# Patient Record
Sex: Female | Born: 1945
Health system: Southern US, Community
[De-identification: ages and names within clinical notes are randomized; demographics above are authoritative.]

## PROBLEM LIST (undated history)

## (undated) DIAGNOSIS — M1990 Unspecified osteoarthritis, unspecified site: Secondary | ICD-10-CM

## (undated) DIAGNOSIS — K219 Gastro-esophageal reflux disease without esophagitis: Secondary | ICD-10-CM

## (undated) DIAGNOSIS — I639 Cerebral infarction, unspecified: Secondary | ICD-10-CM

## (undated) DIAGNOSIS — I10 Essential (primary) hypertension: Secondary | ICD-10-CM

---

## 2019-05-20 ENCOUNTER — Ambulatory Visit: Payer: Self-pay | Attending: Internal Medicine

## 2019-05-20 DIAGNOSIS — Z23 Encounter for immunization: Secondary | ICD-10-CM

## 2019-05-20 NOTE — Progress Notes (Signed)
   Covid-19 Vaccination Clinic  Name:  Alyssa Small    MRN: [id number] DOB: 06-24-1945  05/20/2019  Alyssa Small was observed post Covid-19 immunization for 15 minutes without incident. She was provided with Vaccine Information Sheet and instruction to access the V-Safe system.   Alyssa Small was instructed to call 911 with any severe reactions post vaccine: Marland Kitchen Difficulty breathing  . Swelling of face and throat  . A fast heartbeat  . A bad rash all over body  . Dizziness and weakness   Immunizations Administered    Name Date Dose VIS Date Route   Pfizer COVID-19 Vaccine 05/20/2019  9:55 AM 0.3 mL 02/08/2019 Intramuscular   Manufacturer: [employer], [employer]   Lot: [id number]   NDC: [address]-8

## 2019-06-17 ENCOUNTER — Ambulatory Visit: Payer: Self-pay | Attending: Internal Medicine

## 2019-06-17 DIAGNOSIS — Z23 Encounter for immunization: Secondary | ICD-10-CM

## 2019-06-17 NOTE — Progress Notes (Signed)
   Covid-19 Vaccination Clinic  Name:  Alyssa Small    MRN: [id number] DOB: 04/04/1945  06/17/2019  Alyssa Small was observed post Covid-19 immunization for 15 minutes without incident. She was provided with Vaccine Information Sheet and instruction to access the V-Safe system.   Alyssa Small was instructed to call 911 with any severe reactions post vaccine: Marland Kitchen Difficulty breathing  . Swelling of face and throat  . A fast heartbeat  . A bad rash all over body  . Dizziness and weakness   Immunizations Administered    Name Date Dose VIS Date Route   Pfizer COVID-19 Vaccine 06/17/2019  9:49 AM 0.3 mL 04/24/2018 Intramuscular   Manufacturer: [employer], [employer]   Lot: [id number]   NDC: [address]-2

## 2019-10-07 ENCOUNTER — Other Ambulatory Visit (HOSPITAL_COMMUNITY): Payer: Medicare HMO

## 2019-10-07 ENCOUNTER — Emergency Department (HOSPITAL_COMMUNITY): Payer: Medicare HMO

## 2019-10-07 ENCOUNTER — Encounter (HOSPITAL_COMMUNITY)
Admission: EM | Disposition: A | Discharge status: Home Health | Payer: Self-pay | Source: Home / Self Care | Attending: Neurology

## 2019-10-07 ENCOUNTER — Other Ambulatory Visit: Payer: Self-pay

## 2019-10-07 ENCOUNTER — Emergency Department (HOSPITAL_COMMUNITY): Payer: Medicare HMO | Admitting: Certified Registered Nurse Anesthetist

## 2019-10-07 ENCOUNTER — Inpatient Hospital Stay (HOSPITAL_COMMUNITY)
Admission: EM | Admit: 2019-10-07 | Discharge: 2019-10-12 | DRG: 065 | Disposition: A | Discharge status: Home Health | Payer: Medicare HMO | Attending: Neurology | Admitting: Neurology

## 2019-10-07 ENCOUNTER — Inpatient Hospital Stay (HOSPITAL_COMMUNITY): Payer: Medicare HMO

## 2019-10-07 ENCOUNTER — Encounter (HOSPITAL_COMMUNITY): Payer: Self-pay

## 2019-10-07 DIAGNOSIS — Z8673 Personal history of transient ischemic attack (TIA), and cerebral infarction without residual deficits: Secondary | ICD-10-CM | POA: Diagnosis not present

## 2019-10-07 DIAGNOSIS — I493 Ventricular premature depolarization: Secondary | ICD-10-CM | POA: Diagnosis not present

## 2019-10-07 DIAGNOSIS — R29701 NIHSS score 1: Secondary | ICD-10-CM | POA: Diagnosis present

## 2019-10-07 DIAGNOSIS — R509 Fever, unspecified: Secondary | ICD-10-CM | POA: Diagnosis present

## 2019-10-07 DIAGNOSIS — M1990 Unspecified osteoarthritis, unspecified site: Secondary | ICD-10-CM | POA: Diagnosis present

## 2019-10-07 DIAGNOSIS — E876 Hypokalemia: Secondary | ICD-10-CM | POA: Diagnosis present

## 2019-10-07 DIAGNOSIS — R29702 NIHSS score 2: Secondary | ICD-10-CM | POA: Diagnosis not present

## 2019-10-07 DIAGNOSIS — R414 Neurologic neglect syndrome: Secondary | ICD-10-CM | POA: Diagnosis present

## 2019-10-07 DIAGNOSIS — R1310 Dysphagia, unspecified: Secondary | ICD-10-CM | POA: Diagnosis present

## 2019-10-07 DIAGNOSIS — F329 Major depressive disorder, single episode, unspecified: Secondary | ICD-10-CM | POA: Diagnosis present

## 2019-10-07 DIAGNOSIS — R6510 Systemic inflammatory response syndrome (SIRS) of non-infectious origin without acute organ dysfunction: Secondary | ICD-10-CM | POA: Diagnosis not present

## 2019-10-07 DIAGNOSIS — Z79890 Hormone replacement therapy (postmenopausal): Secondary | ICD-10-CM

## 2019-10-07 DIAGNOSIS — I63411 Cerebral infarction due to embolism of right middle cerebral artery: Principal | ICD-10-CM | POA: Diagnosis present

## 2019-10-07 DIAGNOSIS — E039 Hypothyroidism, unspecified: Secondary | ICD-10-CM | POA: Diagnosis present

## 2019-10-07 DIAGNOSIS — A419 Sepsis, unspecified organism: Secondary | ICD-10-CM

## 2019-10-07 DIAGNOSIS — Z79899 Other long term (current) drug therapy: Secondary | ICD-10-CM

## 2019-10-07 DIAGNOSIS — Z20822 Contact with and (suspected) exposure to covid-19: Secondary | ICD-10-CM | POA: Diagnosis present

## 2019-10-07 DIAGNOSIS — I10 Essential (primary) hypertension: Secondary | ICD-10-CM | POA: Diagnosis present

## 2019-10-07 DIAGNOSIS — R21 Rash and other nonspecific skin eruption: Secondary | ICD-10-CM | POA: Diagnosis not present

## 2019-10-07 DIAGNOSIS — D72828 Other elevated white blood cell count: Secondary | ICD-10-CM | POA: Diagnosis present

## 2019-10-07 DIAGNOSIS — I4891 Unspecified atrial fibrillation: Secondary | ICD-10-CM | POA: Diagnosis present

## 2019-10-07 DIAGNOSIS — Z8249 Family history of ischemic heart disease and other diseases of the circulatory system: Secondary | ICD-10-CM

## 2019-10-07 DIAGNOSIS — E785 Hyperlipidemia, unspecified: Secondary | ICD-10-CM | POA: Diagnosis present

## 2019-10-07 DIAGNOSIS — I639 Cerebral infarction, unspecified: Secondary | ICD-10-CM | POA: Diagnosis present

## 2019-10-07 DIAGNOSIS — I6389 Other cerebral infarction: Secondary | ICD-10-CM | POA: Diagnosis not present

## 2019-10-07 DIAGNOSIS — Z7982 Long term (current) use of aspirin: Secondary | ICD-10-CM

## 2019-10-07 DIAGNOSIS — R29810 Facial weakness: Secondary | ICD-10-CM | POA: Diagnosis present

## 2019-10-07 DIAGNOSIS — G8194 Hemiplegia, unspecified affecting left nondominant side: Secondary | ICD-10-CM | POA: Diagnosis present

## 2019-10-07 DIAGNOSIS — L259 Unspecified contact dermatitis, unspecified cause: Secondary | ICD-10-CM | POA: Diagnosis present

## 2019-10-07 DIAGNOSIS — R29704 NIHSS score 4: Secondary | ICD-10-CM | POA: Diagnosis not present

## 2019-10-07 DIAGNOSIS — K219 Gastro-esophageal reflux disease without esophagitis: Secondary | ICD-10-CM | POA: Diagnosis present

## 2019-10-07 DIAGNOSIS — R29703 NIHSS score 3: Secondary | ICD-10-CM | POA: Diagnosis not present

## 2019-10-07 DIAGNOSIS — R471 Dysarthria and anarthria: Secondary | ICD-10-CM | POA: Diagnosis present

## 2019-10-07 HISTORY — DX: Unspecified osteoarthritis, unspecified site: M19.90

## 2019-10-07 HISTORY — DX: Gastro-esophageal reflux disease without esophagitis: K21.9

## 2019-10-07 HISTORY — DX: Cerebral infarction, unspecified: I63.9

## 2019-10-07 HISTORY — DX: Essential (primary) hypertension: I10

## 2019-10-07 LAB — COMPREHENSIVE METABOLIC PANEL
ALT: 12 U/L (ref 0–44)
AST: 25 U/L (ref 15–41)
Albumin: 3.6 g/dL (ref 3.5–5.0)
Alkaline Phosphatase: 80 U/L (ref 38–126)
Anion gap: 11 (ref 5–15)
BUN: 9 mg/dL (ref 8–23)
CO2: 25 mmol/L (ref 22–32)
Calcium: 9.4 mg/dL (ref 8.9–10.3)
Chloride: 104 mmol/L (ref 98–111)
Creatinine, Ser: 0.94 mg/dL (ref 0.44–1.00)
GFR calc Af Amer: >60 mL/min (ref >60)
GFR calc non Af Amer: 59 mL/min — ABNORMAL LOW (ref >60)
Glucose, Bld: 107 mg/dL — ABNORMAL HIGH (ref 70–99)
Potassium: 4 mmol/L (ref 3.5–5.1)
Sodium: 140 mmol/L (ref 135–145)
Total Bilirubin: 0.9 mg/dL (ref 0.3–1.2)
Total Protein: 7.1 g/dL (ref 6.5–8.1)

## 2019-10-07 LAB — SARS CORONAVIRUS 2 BY RT PCR (HOSPITAL ORDER, PERFORMED IN ~~LOC~~ HOSPITAL LAB): SARS Coronavirus 2: NEGATIVE

## 2019-10-07 LAB — I-STAT CHEM 8, ED
BUN: 10 mg/dL (ref 8–23)
Calcium, Ion: 1.22 mmol/L (ref 1.15–1.40)
Chloride: 103 mmol/L (ref 98–111)
Creatinine, Ser: 0.9 mg/dL (ref 0.44–1.00)
Glucose, Bld: 106 mg/dL — ABNORMAL HIGH (ref 70–99)
HCT: 42 % (ref 36.0–46.0)
Hemoglobin: 14.3 g/dL (ref 12.0–15.0)
Potassium: 3.5 mmol/L (ref 3.5–5.1)
Sodium: 142 mmol/L (ref 135–145)
TCO2: 25 mmol/L (ref 22–32)

## 2019-10-07 LAB — DIFFERENTIAL
Abs Immature Granulocytes: 0.01 10*3/uL (ref 0.00–0.07)
Basophils Absolute: 0.1 10*3/uL (ref 0.0–0.1)
Basophils Relative: 1 %
Eosinophils Absolute: 0.2 10*3/uL (ref 0.0–0.5)
Eosinophils Relative: 3 %
Immature Granulocytes: 0 %
Lymphocytes Relative: 27 %
Lymphs Abs: 1.9 10*3/uL (ref 0.7–4.0)
Monocytes Absolute: 0.6 10*3/uL (ref 0.1–1.0)
Monocytes Relative: 9 %
Neutro Abs: 4.2 10*3/uL (ref 1.7–7.7)
Neutrophils Relative %: 60 %

## 2019-10-07 LAB — CBC
HCT: 41.2 % (ref 36.0–46.0)
Hemoglobin: 13.6 g/dL (ref 12.0–15.0)
MCH: 30 pg (ref 26.0–34.0)
MCHC: 33 g/dL (ref 30.0–36.0)
MCV: 90.9 fL (ref 80.0–100.0)
Platelets: 340 10*3/uL (ref 150–400)
RBC: 4.53 MIL/uL (ref 3.87–5.11)
RDW: 14.6 % (ref 11.5–15.5)
WBC: 7 10*3/uL (ref 4.0–10.5)
nRBC: 0 % (ref 0.0–0.2)

## 2019-10-07 LAB — PROTIME-INR
INR: 1 (ref 0.8–1.2)
Prothrombin Time: 12.9 seconds (ref 11.4–15.2)

## 2019-10-07 LAB — MRSA PCR SCREENING: MRSA by PCR: NEGATIVE

## 2019-10-07 LAB — CBG MONITORING, ED: Glucose-Capillary: 100 mg/dL — ABNORMAL HIGH (ref 70–99)

## 2019-10-07 LAB — APTT: aPTT: 28 seconds (ref 24–36)

## 2019-10-07 SURGERY — IR WITH ANESTHESIA
Anesthesia: General

## 2019-10-07 MED ORDER — SENNOSIDES-DOCUSATE SODIUM 8.6-50 MG PO TABS: 1.0000 | ORAL_TABLET | Freq: Every evening | ORAL | Status: DC | PRN

## 2019-10-07 MED ORDER — ACETAMINOPHEN 650 MG RE SUPP: 650.0000 mg | RECTAL | Status: DC | PRN

## 2019-10-07 MED ORDER — FENTANYL CITRATE (PF) 100 MCG/2ML IJ SOLN
INTRAMUSCULAR | Status: AC
  Filled 2019-10-07: qty 2

## 2019-10-07 MED ORDER — STROKE: EARLY STAGES OF RECOVERY BOOK: Freq: Once | Status: DC

## 2019-10-07 MED ORDER — SODIUM CHLORIDE 0.9% FLUSH
3.0000 mL | Freq: Once | INTRAVENOUS | Status: AC
  Administered 2019-10-07: 3 mL via INTRAVENOUS

## 2019-10-07 MED ORDER — IOHEXOL 350 MG/ML SOLN
80.0000 mL | Freq: Once | INTRAVENOUS | Status: AC | PRN
  Administered 2019-10-07: 80 mL via INTRAVENOUS

## 2019-10-07 MED ORDER — CHLORHEXIDINE GLUCONATE CLOTH 2 % EX PADS
6.0000 | MEDICATED_PAD | Freq: Every day | CUTANEOUS | Status: DC
  Administered 2019-10-07 – 2019-10-09 (×2): 6 via TOPICAL

## 2019-10-07 MED ORDER — SODIUM CHLORIDE 0.9 % IV SOLN: INTRAVENOUS | Status: DC

## 2019-10-07 MED ORDER — ACETAMINOPHEN 160 MG/5ML PO SOLN: 650.0000 mg | ORAL | Status: DC | PRN

## 2019-10-07 MED ORDER — ACETAMINOPHEN 325 MG PO TABS
650.0000 mg | ORAL_TABLET | ORAL | Status: DC | PRN
  Administered 2019-10-08 – 2019-10-11 (×4): 650 mg via ORAL
  Filled 2019-10-07 (×4): qty 2

## 2019-10-07 NOTE — H&P (Deleted)
Neurology Consultation  Reason for Consult: Code stroke Referring Physician: Jeraldine Loots  CC: Facial droop  History is obtained from: Patient  HPI: Alyssa Small is a 74 y.o. female with history of stroke, hypertension and GERD.  Patient was found to be last known normal at 8 AM.  At that time family noted that there was left facial droop and brought patient to the emergency department for further evaluation.  In the ED she was also noted to have left-sided weakness thus code stroke was called while in triage.  On exam however in the CT room there was no noted left-sided weakness and or facial droop however she did have left-sided neglect.  She unfortunately was outside the window for TPA.  CTA was ordered to further evaluate for possible large vessel occlusion.  Currently patient still continues to feel there is nothing wrong with her.   LKW: 8 AM this morning tpa given?: no, out of window Premorbid modified Rankin scale (mRS): 0 NIHSS 1a Level of Conscious.: 0 1b LOC Questions: 0 1c LOC Commands: 0 2 Best Gaze: 0 3 Visual: 0 4 Facial Palsy: 0 5a Motor Arm - left: 0 5b Motor Arm - Right: 0 6a Motor Leg - Left: 0 6b Motor Leg - Right: 0 7 Limb Ataxia: 0 8 Sensory: 0 9 Best Language: 0 10 Dysarthria: 0 11 Extinct. and Inatten.: 1 TOTAL: 1   Past Medical History:  Diagnosis Date  . Arthritis   . GERD (gastroesophageal reflux disease)   . Hypertension   . Stroke [facility])    2007 no deficits    Family History  Problem Relation Age of Onset  . Hypertension Mother   . Hypertension Father    Social History:   reports that she has never smoked. She has never used smokeless tobacco. She reports that she does not drink alcohol and does not use drugs.  Medications  Current Facility-Administered Medications:  .  sodium chloride flush (NS) 0.9 % injection 3 mL, 3 mL, Intravenous, Once, Gerhard Munch, MD No current outpatient medications on file.  ROS:     General ROS:  negative for - chills, fatigue, fever, night sweats, weight gain or weight loss Psychological ROS: negative for - behavioral disorder, hallucinations, memory difficulties, mood swings or suicidal ideation Ophthalmic ROS: negative for - blurry vision, double vision, eye pain or loss of vision ENT ROS: negative for - epistaxis, nasal discharge, oral lesions, sore throat, tinnitus or vertigo Allergy and Immunology ROS: negative for - hives or itchy/watery eyes Hematological and Lymphatic ROS: negative for - bleeding problems, bruising or swollen lymph nodes Endocrine ROS: negative for - galactorrhea, hair pattern changes, polydipsia/polyuria or temperature intolerance Respiratory ROS: negative for - cough, hemoptysis, shortness of breath or wheezing Cardiovascular ROS: negative for - chest pain, dyspnea on exertion, edema or irregular heartbeat Gastrointestinal ROS: negative for - abdominal pain, diarrhea, hematemesis, nausea/vomiting or stool incontinence Genito-Urinary ROS: negative for - dysuria, hematuria, incontinence or urinary frequency/urgency Musculoskeletal ROS: negative for - joint swelling or muscular weakness Neurological ROS: as noted in HPI Dermatological ROS: negative for rash and skin lesion changes  Exam: Current vital signs: BP (!) 163/74 (BP Location: Right Arm)   Pulse 76   Temp 98.8 F (37.1 C) (Oral)   Resp 19   SpO2 98%  Vital signs in last 24 hours: Temp:  [98.8 F (37.1 C)] 98.8 F (37.1 C) (08/09 1257) Pulse Rate:  [72-76] 76 (08/09 1355) Resp:  [15-19] 19 (08/09 1355) BP: (147-163)/(73-74)  163/74 (08/09 1355) SpO2:  [96 %-98 %] 98 % (08/09 1355)   Constitutional: Appears well-developed and well-nourished.  Eyes: No scleral injection HENT: No OP obstrucion Head: Normocephalic.  Cardiovascular: Normal rate and regular rhythm.  Respiratory: Effort normal, non-labored breathing GI: Soft.  No distension. There is no tenderness.  Skin: WDI  Neuro: Mental  Status: Patient is awake, alert, oriented to person, place, month, year, and situation.  Speech is clear with no aphasia or dysarthria.  She is able to name, repeat and has good comprehension.  Patient is able to follow commands without any difficulties. Cranial Nerves: II: Visual Fields are full.  III,IV, VI: EOMI without ptosis or diploplia. Pupils equal, round and reactive to light V: Facial sensation is symmetric to temperature VII: Facial movement is symmetric.  VIII: hearing is intact to voice X: Palat elevates symmetrically XI: Shoulder shrug is symmetric. XII: tongue is midline without atrophy or fasciculations.  Motor: Tone is normal. Bulk is normal. 5/5 strength was present in all four extremities.  No drift Sensory: Sensation is symmetric to light touch and temperature in the arms and legs. DSS does show neglect to the left arm and leg Deep Tendon Reflexes: 2+ and symmetric in the biceps and patellae.  Plantars: Toes are downgoing bilaterally.  Cerebellar: FNF and HKS are intact bilaterally  Labs I have reviewed labs in epic and the results pertinent to this consultation are:   CBC    Component Value Date/Time   WBC 7.0 10/07/2019 1301   RBC 4.53 10/07/2019 1301   HGB 14.3 10/07/2019 1309   HCT 42.0 10/07/2019 1309   PLT 340 10/07/2019 1301   MCV 90.9 10/07/2019 1301   MCH 30.0 10/07/2019 1301   MCHC 33.0 10/07/2019 1301   RDW 14.6 10/07/2019 1301   LYMPHSABS 1.9 10/07/2019 1301   MONOABS 0.6 10/07/2019 1301   EOSABS 0.2 10/07/2019 1301   BASOSABS 0.1 10/07/2019 1301    CMP     Component Value Date/Time   NA 142 10/07/2019 1309   K 3.5 10/07/2019 1309   CL 103 10/07/2019 1309   CO2 25 10/07/2019 1301   GLUCOSE 106 (H) 10/07/2019 1309   BUN 10 10/07/2019 1309   CREATININE 0.90 10/07/2019 1309   CALCIUM 9.4 10/07/2019 1301   PROT 7.1 10/07/2019 1301   ALBUMIN 3.6 10/07/2019 1301   AST 25 10/07/2019 1301   ALT 12 10/07/2019 1301   ALKPHOS 80  10/07/2019 1301   BILITOT 0.9 10/07/2019 1301   GFRNONAA 59 (L) 10/07/2019 1301   GFRAA >60 10/07/2019 1301   Imaging I have reviewed the images obtained:  CT-scan of the brain-no acute intracranial findings.  Old left cerebellar infarct.  CTA-head and neck/CT perfusion-there is occlusion of the proximal right M2 MCA branch with som  reconstruction more distally.  Perfusion imaging demonstrates no evidence of core infarct.  However there is a 33 mm of penumbra identified in the posterior right MCA territory.  It is also noted that there is no measurable stenosis in the neck.  There is a 1 mm outpouching the distal supraclinoid left ICA likely affecting the infundibulum  Felicie Morn PA-C [facility] [phone]  M-F  (9:00 am- 5:00 PM)  10/07/2019, 2:01 PM     Assessment/plan This 74 year old female brought to the emergency department after family noted a left facial droop.  While in the triage she had left-sided weakness which resolved by the time she had reached CT.  It was noted however she  did have left-sided neglect to DSS.  CTA and CTP were obtained which did show a right M2 MCA branch proximal occlusion.  Patient was out of the window for TPA however was brought to interventional radiology for percutaneous artery thrombectomy.   Plan:   Acuity: Acute -Admit to: ICU -Continue Statin -Blood pressure control, goal of SYS <180 -MRI/ECHO/A1C/Lipid panel. -Hyperglycemia management per SSI to maintain glucose 140-180mg /dL. -PT/OT/ST therapies and recommendations when able  CNS -Close neuro monitoring  CV -Aggressive BP control, goal SBP <180 -Titrate oral agents and IV agents if necessary  Hyperlipidemia, unspecified  - Statin for goal LDL < 70   -goal HgbA1c < 7  Fluids -Replete gently at 75 cc/h  Prophylaxis DVT: SCDs GI: Protonix Bowel: Senokot  Diet: NPO until cleared by speech  Code Status: Full Code

## 2019-10-07 NOTE — ED Notes (Addendum)
Provider made aware of acute changes in pt status

## 2019-10-07 NOTE — ED Notes (Signed)
Patient transported to MRI 

## 2019-10-07 NOTE — ED Triage Notes (Signed)
Pt BIB [facility], from home, family and FD witnessed Left side facial droop, Left arm weakness, leaning to the Left and slurred speech, LKW 0800. Sister found her at 11am with these symptoms, lasting 30 mins, symptoms resolved prior to EMS arrival, no new symptoms in transport. NIH 0  HR 82 NSR BP 220/110 manual, 168/108 RR 16 CBG 91 96% RA  RR 18  18G LAC

## 2019-10-07 NOTE — Consult Note (Addendum)
Neurology Consultation  Reason for Consult: Code stroke Referring Physician: Jeraldine Loots  CC: Facial droop  History is obtained from: Patient  HPI: Alyssa Small is a 74 y.o. female with history of stroke, hypertension and GERD.  Patient was found to be last known normal at 8 AM.  At that time family noted that there was left facial droop and brought patient to the emergency department for further evaluation.  In the ED she was initially felt to be normal but then later noted to again have left-sided symptoms thus code stroke was called while in triage.  On exam however in the CT room there was no noted left-sided weakness and or facial droop however she did have left-sided neglect.  She unfortunately was outside the window for TPA.  CTA was ordered to further evaluate for possible large vessel occlusion.  Currently patient still continues to feel there is nothing wrong with her.   LKW: 8 AM this morning tpa given?: no, out of window Premorbid modified Rankin scale (mRS): 0 NIHSS 1a Level of Conscious.: 0 1b LOC Questions: 0 1c LOC Commands: 0 2 Best Gaze: 0 3 Visual: 0 4 Facial Palsy: 0 5a Motor Arm - left: 0 5b Motor Arm - Right: 0 6a Motor Leg - Left: 0 6b Motor Leg - Right: 0 7 Limb Ataxia: 0 8 Sensory: 0 9 Best Language: 0 10 Dysarthria: 0 11 Extinct. and Inatten.: 1 TOTAL: 1   Past Medical History:  Diagnosis Date  . Arthritis   . GERD (gastroesophageal reflux disease)   . Hypertension   . Stroke [facility])    2007 no deficits    Family History  Problem Relation Age of Onset  . Hypertension Mother   . Hypertension Father    Social History:   reports that she has never smoked. She has never used smokeless tobacco. She reports that she does not drink alcohol and does not use drugs.  Medications  Current Facility-Administered Medications:  .  sodium chloride flush (NS) 0.9 % injection 3 mL, 3 mL, Intravenous, Once, Gerhard Munch, MD No current outpatient  medications on file.  ROS:     General ROS: negative for - chills, fatigue, fever, night sweats, weight gain or weight loss Psychological ROS: negative for - behavioral disorder, hallucinations, memory difficulties, mood swings or suicidal ideation Ophthalmic ROS: negative for - blurry vision, double vision, eye pain or loss of vision ENT ROS: negative for - epistaxis, nasal discharge, oral lesions, sore throat, tinnitus or vertigo Allergy and Immunology ROS: negative for - hives or itchy/watery eyes Hematological and Lymphatic ROS: negative for - bleeding problems, bruising or swollen lymph nodes Endocrine ROS: negative for - galactorrhea, hair pattern changes, polydipsia/polyuria or temperature intolerance Respiratory ROS: negative for - cough, hemoptysis, shortness of breath or wheezing Cardiovascular ROS: negative for - chest pain, dyspnea on exertion, edema or irregular heartbeat Gastrointestinal ROS: negative for - abdominal pain, diarrhea, hematemesis, nausea/vomiting or stool incontinence Genito-Urinary ROS: negative for - dysuria, hematuria, incontinence or urinary frequency/urgency Musculoskeletal ROS: negative for - joint swelling or muscular weakness Neurological ROS: as noted in HPI Dermatological ROS: negative for rash and skin lesion changes  Exam: Current vital signs: BP (!) 163/74 (BP Location: Right Arm)   Pulse 76   Temp 98.8 F (37.1 C) (Oral)   Resp 19   SpO2 98%  Vital signs in last 24 hours: Temp:  [98.8 F (37.1 C)] 98.8 F (37.1 C) (08/09 1257) Pulse Rate:  [72-76] 76 (08/09 1355)  Resp:  [15-19] 19 (08/09 1355) BP: (147-163)/(73-74) 163/74 (08/09 1355) SpO2:  [96 %-98 %] 98 % (08/09 1355)   Constitutional: Appears well-developed and well-nourished.  Eyes: No scleral injection HENT: No OP obstrucion Head: Normocephalic.  Cardiovascular: Normal rate and regular rhythm.  Respiratory: Effort normal, non-labored breathing GI: Soft.  No distension. There  is no tenderness.  Skin: WDI  Neuro: Mental Status: Patient is awake, alert, oriented to person, place, month, year, and situation.  Speech is clear with no aphasia or dysarthria.  She is able to name, repeat and has good comprehension.  Patient is able to follow commands without any difficulties. Cranial Nerves: II: Visual Fields are full.  III,IV, VI: EOMI without ptosis or diploplia. Pupils equal, round and reactive to light V: Facial sensation is symmetric to temperature VII: Facial movement is symmetric.  VIII: hearing is intact to voice X: Palat elevates symmetrically XI: Shoulder shrug is symmetric. XII: tongue is midline without atrophy or fasciculations.  Motor: Tone is normal. Bulk is normal. 5/5 strength was present in all four extremities.  No drift Sensory: Sensation is symmetric to light touch and temperature in the arms and legs. DSS does show neglect to the left arm and leg Deep Tendon Reflexes: 2+ and symmetric in the biceps and patellae.  Plantars: Toes are downgoing bilaterally.  Cerebellar: FNF and HKS are intact bilaterally  Labs I have reviewed labs in epic and the results pertinent to this consultation are:   CBC    Component Value Date/Time   WBC 7.0 10/07/2019 1301   RBC 4.53 10/07/2019 1301   HGB 14.3 10/07/2019 1309   HCT 42.0 10/07/2019 1309   PLT 340 10/07/2019 1301   MCV 90.9 10/07/2019 1301   MCH 30.0 10/07/2019 1301   MCHC 33.0 10/07/2019 1301   RDW 14.6 10/07/2019 1301   LYMPHSABS 1.9 10/07/2019 1301   MONOABS 0.6 10/07/2019 1301   EOSABS 0.2 10/07/2019 1301   BASOSABS 0.1 10/07/2019 1301    CMP     Component Value Date/Time   NA 142 10/07/2019 1309   K 3.5 10/07/2019 1309   CL 103 10/07/2019 1309   CO2 25 10/07/2019 1301   GLUCOSE 106 (H) 10/07/2019 1309   BUN 10 10/07/2019 1309   CREATININE 0.90 10/07/2019 1309   CALCIUM 9.4 10/07/2019 1301   PROT 7.1 10/07/2019 1301   ALBUMIN 3.6 10/07/2019 1301   AST 25 10/07/2019  1301   ALT 12 10/07/2019 1301   ALKPHOS 80 10/07/2019 1301   BILITOT 0.9 10/07/2019 1301   GFRNONAA 59 (L) 10/07/2019 1301   GFRAA >60 10/07/2019 1301   Imaging I have reviewed the images obtained:  CT-scan of the brain-no acute intracranial findings.  Old left cerebellar infarct.  CTA-head and neck/CT perfusion-there is occlusion of the proximal right M2 MCA branch with som  reconstruction more distally.  Perfusion imaging demonstrates no evidence of core infarct.  However there is a 33 mm of penumbra identified in the posterior right MCA territory.  It is also noted that there is no measurable stenosis in the neck.  There is a 1 mm outpouching the distal supraclinoid left ICA likely affecting the infundibulum  Felicie Morn PA-C [facility] [phone]  M-F  (9:00 am- 5:00 PM)  10/07/2019, 2:01 PM     Assessment/plan This 74 year old female brought to the emergency department after family noted a left facial droop.  While in the triage she had left-sided weakness which resolved by the time she had  reached CT.  It was noted however she did have left-sided neglect to DSS.  CTA and CTP were obtained which did show a right M2 MCA branch proximal occlusion.  Patient was out of the window for TPA however was brought to interventional radiology for percutaneous artery thrombectomy given concern for waxing and waning symptoms and potential for eventual failure of collateral circulation.  After discussion of risks and benefits with interventional radiology and family, the decision was made to defer intervention, especially given that even the patient's neglect had resolved by the time of arrival to the IR suite and her NIH score had improved to 0.  Recommend -MRI of the brain without contrast -Transthoracic Echo,   -Start patient on ASA 325mg  daily  -Start or continue Atorvastatin 80 mg/other high intensity statin -BP goal: permissive HTN upto 220/120 mmHg -HBAIC and Lipid  profile -Telemetry monitoring -Frequent neuro checks (every 2 hours until stable) -NPO until passes stroke swallow screen -PT/OT --Stroke team will follow in consultation  # please page stroke NP  Or  PA  Or MD from 8am -4 pm  as this patient from this time will be  followed by the stroke.   You can look them up on [email or web address]  Password [email or web address]   Addendum: I personally examined the patient, gathered history from the patient and family as well as chart review, reviewed the imaging including head CT, CTA, CT perfusion, and MRI, coordinated care with neuro interventional radiology, counseled the patient and family.  I have edited the APP note above as necessary.  In addition, later in the afternoon (4:30 PM) the patient worsened again, with a significant left-sided facial droop, dysarthria, and neglect although she remained strong in the left upper extremity and left lower extremity.  She was reevaluated and again an extended discussion with family and patient given her nondisabling symptoms and the significant risk of a tandem stenosis/distal occlusion the decision was made to continue with medical management only.    Addended for charge capture

## 2019-10-07 NOTE — ED Provider Notes (Signed)
[facility] EMERGENCY DEPARTMENT Provider Note   CSN: [id number] Arrival date & time: 10/07/19  1246  An emergency department physician performed an initial assessment on this suspected stroke patient at 1351.  History Chief Complaint  Patient presents with  . Transient Ischemic Attack    Alyssa Small is a 74 y.o. female.  The history is provided by the patient and a relative.  Neurologic Problem This is a new problem. The current episode started 3 to 5 hours ago. The problem occurs rarely. The problem has been resolved. Pertinent negatives include no chest pain, no abdominal pain, no headaches and no shortness of breath. Nothing aggravates the symptoms. Nothing relieves the symptoms. She has tried nothing for the symptoms. The treatment provided no relief.   74 year old female with history of stroke in 2007 without residual deficits presents with concerns for new neurologic deficits.  Patient last known well at 800 this morning.  Patient was found by her niece and was found to have left-sided weakness as well as slurred speech.  The symptoms lasted less than 30 minutes before resolution.  Patient was hypertensive with EMS and upon arrival to the emergency department patient had another brief episode of left-sided weakness.  Patient does not remember feeling weak earlier this morning.  Denies fever, chills, headache, weakness, numbness, tingling.   Past Medical History:  Diagnosis Date  . Arthritis   . GERD (gastroesophageal reflux disease)   . Hypertension   . Stroke [facility])    2007 no deficits     Patient Active Problem List   Diagnosis Date Noted  . Stroke (cerebrum) (HCC) 10/07/2019    History reviewed. No pertinent surgical history.   OB History   No obstetric history on file.     Family History  Problem Relation Age of Onset  . Hypertension Mother   . Hypertension Father     Social History   Tobacco Use  . Smoking status: Never Smoker  .  Smokeless tobacco: Never Used  Substance Use Topics  . Alcohol use: Never  . Drug use: Never    Home Medications Prior to Admission medications   Medication Sig Start Date End Date Taking? Authorizing Provider  aspirin EC 81 MG tablet Take 81 mg by mouth daily. Swallow whole.   Yes [provider]  levothyroxine (SYNTHROID) 75 MCG tablet Take 75 mcg by mouth every morning. 07/12/19  Yes [provider]  simvastatin (ZOCOR) 20 MG tablet Take 20 mg by mouth at bedtime. 07/12/19  Yes [provider]  traZODone (DESYREL) 50 MG tablet Take 50 mg by mouth at bedtime. 08/29/19   [provider]    Allergies    Patient has no known allergies.  Review of Systems   Review of Systems  Constitutional: Negative for chills and fever.  HENT: Negative for ear pain and sore throat.   Eyes: Negative for pain and visual disturbance.  Respiratory: Negative for cough and shortness of breath.   Cardiovascular: Negative for chest pain and palpitations.  Gastrointestinal: Negative for abdominal pain and vomiting.  Genitourinary: Negative for dysuria and hematuria.  Musculoskeletal: Negative for arthralgias and back pain.  Skin: Negative for color change and rash.  Neurological: Negative for seizures, syncope and headaches.       L sided weakness and slurred speech now resolved.  All other systems reviewed and are negative.   Physical Exam Updated Vital Signs BP (!) 161/79   Pulse 73   Temp 98.8 F (37.1  C) (Oral)   Resp (!) 23   SpO2 96%   Physical Exam Vitals and nursing note reviewed.  Constitutional:      General: She is not in acute distress.    Appearance: She is well-developed.  HENT:     Head: Normocephalic and atraumatic.  Eyes:     Conjunctiva/sclera: Conjunctivae normal.  Cardiovascular:     Rate and Rhythm: Normal rate and regular rhythm.     Heart sounds: No murmur heard.   Pulmonary:     Effort: Pulmonary effort is normal. No respiratory  distress.     Breath sounds: Normal breath sounds.  Abdominal:     Palpations: Abdomen is soft.     Tenderness: There is no abdominal tenderness.  Musculoskeletal:     Cervical back: Neck supple.  Skin:    General: Skin is warm and dry.  Neurological:     General: No focal deficit present.     Mental Status: She is alert and oriented to person, place, and time. Mental status is at baseline.     Cranial Nerves: Cranial nerves are intact. No cranial nerve deficit.     Sensory: Sensation is intact. No sensory deficit.     Motor: Motor function is intact. No weakness.     Coordination: Coordination normal.     ED Results / Procedures / Treatments   Labs (all labs ordered are listed, but only abnormal results are displayed) Labs Reviewed  COMPREHENSIVE METABOLIC PANEL - Abnormal; Notable for the following components:      Result Value   Glucose, Bld 107 (*)    GFR calc non Af Amer 59 (*)    All other components within normal limits  I-STAT CHEM 8, ED - Abnormal; Notable for the following components:   Glucose, Bld 106 (*)    All other components within normal limits  SARS CORONAVIRUS 2 BY RT PCR [facility] ORDER, PERFORMED IN [place] [facility])  PROTIME-INR  APTT  CBC  DIFFERENTIAL  CBG MONITORING, ED    EKG EKG Interpretation  Date/Time:  Monday October 07 2019 12:53:33 EDT Ventricular Rate:  75 PR Interval:  162 QRS Duration: 76 QT Interval:  434 QTC Calculation: 484 R Axis:   33 Text Interpretation: Normal sinus rhythm Artifact Otherwise within normal limits Confirmed by Gerhard Munch [phone]) on 10/07/2019 1:28:12 PM   Radiology CT HEAD WO CONTRAST  Result Date: 10/07/2019 CLINICAL DATA:  Left-sided weakness.  Transient ischemic attack. EXAM: CT HEAD WITHOUT CONTRAST TECHNIQUE: Contiguous axial images were obtained from the base of the skull through the vertex without intravenous contrast. COMPARISON:  CT head 11/01/2014. FINDINGS: Brain: There is no evidence  of acute intracranial hemorrhage, mass lesion, brain edema or extra-axial fluid collection. Old inferior left cerebellar infarct again noted with associated encephalomalacia. The ventricles and subarachnoid spaces are otherwise appropriately size for age. There is mild asymmetric gliosis in the right occipital white matter. There is no CT evidence of acute cortical infarction. Vascular: Intracranial vascular calcifications. No hyperdense vessel identified. Skull: Negative for fracture or focal lesion. Sinuses/Orbits: The visualized paranasal sinuses and mastoid air cells are clear. No orbital abnormalities are seen. Other: None. IMPRESSION: 1. No acute intracranial findings. 2. Old left cerebellar infarct. Electronically Signed   By: Carey Bullocks M.D.   On: 10/07/2019 13:33   CT CEREBRAL PERFUSION W CONTRAST  Result Date: 10/07/2019 CLINICAL DATA:  Left-sided weakness EXAM: CT ANGIOGRAPHY HEAD AND NECK CT PERFUSION BRAIN TECHNIQUE: Multidetector CT imaging of  the head and neck was performed using the standard protocol during bolus administration of intravenous contrast. Multiplanar CT image reconstructions and MIPs were obtained to evaluate the vascular anatomy. Carotid stenosis measurements (when applicable) are obtained utilizing NASCET criteria, using the distal internal carotid diameter as the denominator. Multiphase CT imaging of the brain was performed following IV bolus contrast injection. Subsequent parametric perfusion maps were calculated using RAPID software. CONTRAST:  80mL OMNIPAQUE IOHEXOL 350 MG/ML SOLN COMPARISON:  None. FINDINGS: CTA NECK FINDINGS Aortic arch: Great vessel origins are patent. Right carotid system: Patent. Partially retropharyngeal course of the ICA. No measurable stenosis. Left carotid system: Patent. Partially retropharyngeal course of the ICA. No measurable stenosis. Vertebral arteries: Patent and codominant. Skeleton: Cervical spine degenerative changes. Other neck: No mass  or adenopathy. Upper chest: Included upper lungs are clear. Review of the MIP images confirms the above findings CTA HEAD FINDINGS Anterior circulation: Intracranial internal carotid arteries are patent with mild calcified plaque. There is a 1 mm inferiorly directed outpouching of the distal supraclinoid left ICA. Appears to be a diminutive branch arising from this area. Right M1 MCA is patent. There is occlusion of a proximal right M2 MCA branch with some reconstitution more distally. Left middle and both anterior cerebral arteries are patent. An anterior communicating artery is present. Posterior circulation: Intracranial vertebral arteries, basilar artery, and posterior cerebral arteries are patent Venous sinuses: As permitted by contrast timing, patent. Review of the MIP images confirms the above findings CT Brain Perfusion Findings: ASPECTS: 10 CBF (<30%) Volume: 0mL Perfusion (Tmax>6.0s) volume: 33mL Mismatch Volume: 33mL Infarction Location:Posterior right MCA territory. IMPRESSION: There is occlusion of a proximal right M2 MCA branch with some reconstitution more distally. Perfusion imaging demonstrates no evidence of core infarction. However, there is 33 mL of penumbra identified in the posterior right MCA territory. No measurable stenosis in the neck. 1 mm outpouching of the distal supraclinoid left ICA likely reflecting an infundibulum. These results were called by telephone at the time of interpretation on 10/07/2019 at 1:51 pm to provider Dr. Iver NestleBhagat, who verbally acknowledged these results. Electronically Signed   By: Guadlupe SpanishPraneil  Patel M.D.   On: 10/07/2019 14:01   CT ANGIO HEAD CODE STROKE  Result Date: 10/07/2019 CLINICAL DATA:  Left-sided weakness EXAM: CT ANGIOGRAPHY HEAD AND NECK CT PERFUSION BRAIN TECHNIQUE: Multidetector CT imaging of the head and neck was performed using the standard protocol during bolus administration of intravenous contrast. Multiplanar CT image reconstructions and MIPs were  obtained to evaluate the vascular anatomy. Carotid stenosis measurements (when applicable) are obtained utilizing NASCET criteria, using the distal internal carotid diameter as the denominator. Multiphase CT imaging of the brain was performed following IV bolus contrast injection. Subsequent parametric perfusion maps were calculated using RAPID software. CONTRAST:  80mL OMNIPAQUE IOHEXOL 350 MG/ML SOLN COMPARISON:  None. FINDINGS: CTA NECK FINDINGS Aortic arch: Great vessel origins are patent. Right carotid system: Patent. Partially retropharyngeal course of the ICA. No measurable stenosis. Left carotid system: Patent. Partially retropharyngeal course of the ICA. No measurable stenosis. Vertebral arteries: Patent and codominant. Skeleton: Cervical spine degenerative changes. Other neck: No mass or adenopathy. Upper chest: Included upper lungs are clear. Review of the MIP images confirms the above findings CTA HEAD FINDINGS Anterior circulation: Intracranial internal carotid arteries are patent with mild calcified plaque. There is a 1 mm inferiorly directed outpouching of the distal supraclinoid left ICA. Appears to be a diminutive branch arising from this area. Right M1 MCA is patent. There is  occlusion of a proximal right M2 MCA branch with some reconstitution more distally. Left middle and both anterior cerebral arteries are patent. An anterior communicating artery is present. Posterior circulation: Intracranial vertebral arteries, basilar artery, and posterior cerebral arteries are patent Venous sinuses: As permitted by contrast timing, patent. Review of the MIP images confirms the above findings CT Brain Perfusion Findings: ASPECTS: 10 CBF (<30%) Volume: 54mL Perfusion (Tmax>6.0s) volume: 73mL Mismatch Volume: 51mL Infarction Location:Posterior right MCA territory. IMPRESSION: There is occlusion of a proximal right M2 MCA branch with some reconstitution more distally. Perfusion imaging demonstrates no evidence of  core infarction. However, there is 33 mL of penumbra identified in the posterior right MCA territory. No measurable stenosis in the neck. 1 mm outpouching of the distal supraclinoid left ICA likely reflecting an infundibulum. These results were called by telephone at the time of interpretation on 10/07/2019 at 1:51 pm to provider Dr. Iver Nestle, who verbally acknowledged these results. Electronically Signed   By: Guadlupe Spanish M.D.   On: 10/07/2019 14:01   CT ANGIO NECK CODE STROKE  Result Date: 10/07/2019 CLINICAL DATA:  Left-sided weakness EXAM: CT ANGIOGRAPHY HEAD AND NECK CT PERFUSION BRAIN TECHNIQUE: Multidetector CT imaging of the head and neck was performed using the standard protocol during bolus administration of intravenous contrast. Multiplanar CT image reconstructions and MIPs were obtained to evaluate the vascular anatomy. Carotid stenosis measurements (when applicable) are obtained utilizing NASCET criteria, using the distal internal carotid diameter as the denominator. Multiphase CT imaging of the brain was performed following IV bolus contrast injection. Subsequent parametric perfusion maps were calculated using RAPID software. CONTRAST:  46mL OMNIPAQUE IOHEXOL 350 MG/ML SOLN COMPARISON:  None. FINDINGS: CTA NECK FINDINGS Aortic arch: Great vessel origins are patent. Right carotid system: Patent. Partially retropharyngeal course of the ICA. No measurable stenosis. Left carotid system: Patent. Partially retropharyngeal course of the ICA. No measurable stenosis. Vertebral arteries: Patent and codominant. Skeleton: Cervical spine degenerative changes. Other neck: No mass or adenopathy. Upper chest: Included upper lungs are clear. Review of the MIP images confirms the above findings CTA HEAD FINDINGS Anterior circulation: Intracranial internal carotid arteries are patent with mild calcified plaque. There is a 1 mm inferiorly directed outpouching of the distal supraclinoid left ICA. Appears to be a  diminutive branch arising from this area. Right M1 MCA is patent. There is occlusion of a proximal right M2 MCA branch with some reconstitution more distally. Left middle and both anterior cerebral arteries are patent. An anterior communicating artery is present. Posterior circulation: Intracranial vertebral arteries, basilar artery, and posterior cerebral arteries are patent Venous sinuses: As permitted by contrast timing, patent. Review of the MIP images confirms the above findings CT Brain Perfusion Findings: ASPECTS: 10 CBF (<30%) Volume: 33mL Perfusion (Tmax>6.0s) volume: 2mL Mismatch Volume: 89mL Infarction Location:Posterior right MCA territory. IMPRESSION: There is occlusion of a proximal right M2 MCA branch with some reconstitution more distally. Perfusion imaging demonstrates no evidence of core infarction. However, there is 33 mL of penumbra identified in the posterior right MCA territory. No measurable stenosis in the neck. 1 mm outpouching of the distal supraclinoid left ICA likely reflecting an infundibulum. These results were called by telephone at the time of interpretation on 10/07/2019 at 1:51 pm to provider Dr. Iver Nestle, who verbally acknowledged these results. Electronically Signed   By: Guadlupe Spanish M.D.   On: 10/07/2019 14:01    Procedures Procedures (including critical care time)  Medications Ordered in ED Medications  sodium chloride flush (NS)  0.9 % injection 3 mL (has no administration in time range)   stroke: mapping our early stages of recovery book (has no administration in time range)  acetaminophen (TYLENOL) tablet 650 mg (has no administration in time range)    Or  acetaminophen (TYLENOL) 160 MG/5ML solution 650 mg (has no administration in time range)    Or  acetaminophen (TYLENOL) suppository 650 mg (has no administration in time range)  senna-docusate (Senokot-S) tablet 1 tablet (has no administration in time range)  0.9 %  sodium chloride infusion (has no  administration in time range)  iohexol (OMNIPAQUE) 350 MG/ML injection 80 mL (80 mLs Intravenous Contrast Given 10/07/19 1344)    ED Course  I have reviewed the triage vital signs and the nursing notes.  Pertinent labs & imaging results that were available during my care of the patient were reviewed by me and considered in my medical decision making (see chart for details).    MDM Rules/Calculators/A&P                          74 year old female presents as a code stroke after having intermittent left-sided weakness and slurred speech this morning now resolved.  Afebrile vital signs stable.  Exam as above.  No focal neurologic deficits on physical exam.  CT image as well as labs pending at this time.  CTA and CT perfusion notable for R M2 branch occlusion w/o core infarct.  Pt is currently out of window for tPA.  Neuro evaluated the images and the pt at bedside and recommended Percutaneous thrombectomy.  Pt was transported to the IR suite in stable condition w/o further events.  Final Clinical Impression(s) / ED Diagnoses Final diagnoses:  Stroke [facility])    Rx / DC Orders ED Discharge Orders    None       Rickey Primus, MD 10/07/19 1440    Gerhard Munch, MD 10/07/19 [phone]

## 2019-10-07 NOTE — Code Documentation (Signed)
Patient from home with sister and neice who is the caretaker. She was LKW at 2000 on 10/06/19. She came up the stairs around 1100 this am and niece noticed she was drooling and slurring her speech when helping get their family member out of bed. Family called 37 and she was taken to [facility] [place]. Pt with hx of stroke about 5 years ago, HTN. On arrival pt was asymptomatic. With the triage RN she started showing some weakness on the left side. A code stroke was called and pt was taken to CT and met by the stroke team and MD. A CT/CTA/CTP were all completed. Symptoms once again resolved.   CTA: "There is occlusion of a proximal right M2 MCA branch with some reconstitution more distally. Perfusion imaging demonstrates no evidence of core infarction. However, there is 33 mL of penumbra identified in the posterior right MCA territory."   Code IR called by the Neurologist. After further review with the Interventional Radiologist it was decided not to pursue intervention because pt's NIHSS is a 0. Pt is outside of the window for TPA and she has no symptoms at this time. Care Plan: Allow permissive HTN up to 220/120, frequent neuro checks/vitals q30 until 2000 (24 hour mark) and then q2 hours. Routine MRI. Family at the bedside and all questions have been answered. RN Zelphia Cairo at the bedside and aware of the plan. Code IR canceled. Airrion Otting, Rande Brunt, RN, SCRN

## 2019-10-08 ENCOUNTER — Inpatient Hospital Stay (HOSPITAL_COMMUNITY): Payer: Medicare HMO

## 2019-10-08 DIAGNOSIS — I63411 Cerebral infarction due to embolism of right middle cerebral artery: Principal | ICD-10-CM

## 2019-10-08 DIAGNOSIS — I6389 Other cerebral infarction: Secondary | ICD-10-CM

## 2019-10-08 DIAGNOSIS — I4891 Unspecified atrial fibrillation: Secondary | ICD-10-CM

## 2019-10-08 LAB — ECHOCARDIOGRAM COMPLETE
Area-P 1/2: 3.65 cm2
S' Lateral: 2.8 cm

## 2019-10-08 LAB — LIPID PANEL
Cholesterol: 171 mg/dL (ref 0–200)
HDL: 81 mg/dL (ref >40)
LDL Cholesterol: 81 mg/dL (ref 0–99)
Total CHOL/HDL Ratio: 2.1 RATIO
Triglycerides: 43 mg/dL (ref <150)
VLDL: 9 mg/dL (ref 0–40)

## 2019-10-08 LAB — C DIFFICILE QUICK SCREEN W PCR REFLEX
C Diff antigen: NEGATIVE
C Diff interpretation: NOT DETECTED
C Diff toxin: NEGATIVE

## 2019-10-08 LAB — HEMOGLOBIN A1C
Hgb A1c MFr Bld: 5.5 % (ref 4.8–5.6)
Mean Plasma Glucose: 111.15 mg/dL

## 2019-10-08 MED ORDER — DIPHENHYDRAMINE HCL 25 MG PO CAPS
50.0000 mg | ORAL_CAPSULE | Freq: Once | ORAL | Status: AC
  Administered 2019-10-08: 50 mg via ORAL
  Filled 2019-10-08: qty 2

## 2019-10-08 MED ORDER — METOPROLOL TARTRATE 5 MG/5ML IV SOLN
5.0000 mg | Freq: Once | INTRAVENOUS | Status: AC
  Administered 2019-10-08: 5 mg via INTRAVENOUS
  Filled 2019-10-08: qty 5

## 2019-10-08 MED ORDER — ASPIRIN EC 325 MG PO TBEC
325.0000 mg | DELAYED_RELEASE_TABLET | Freq: Every day | ORAL | Status: DC
  Administered 2019-10-08 – 2019-10-10 (×3): 325 mg via ORAL
  Filled 2019-10-08 (×3): qty 1

## 2019-10-08 MED ORDER — FLUTICASONE PROPIONATE 50 MCG/ACT NA SUSP
1.0000 | Freq: Every day | NASAL | Status: DC | PRN
  Filled 2019-10-08: qty 16

## 2019-10-08 MED ORDER — ALBUTEROL SULFATE (2.5 MG/3ML) 0.083% IN NEBU
3.0000 mL | INHALATION_SOLUTION | RESPIRATORY_TRACT | Status: DC | PRN
  Administered 2019-10-11: 3 mL via RESPIRATORY_TRACT
  Filled 2019-10-08: qty 3

## 2019-10-08 MED ORDER — CLOPIDOGREL BISULFATE 75 MG PO TABS
75.0000 mg | ORAL_TABLET | Freq: Every day | ORAL | Status: DC
  Administered 2019-10-08: 75 mg via ORAL
  Filled 2019-10-08: qty 1

## 2019-10-08 MED ORDER — PANTOPRAZOLE SODIUM 40 MG PO TBEC
40.0000 mg | DELAYED_RELEASE_TABLET | Freq: Every day | ORAL | Status: DC
  Administered 2019-10-08 – 2019-10-12 (×5): 40 mg via ORAL
  Filled 2019-10-08 (×5): qty 1

## 2019-10-08 MED ORDER — SIMVASTATIN 20 MG PO TABS
40.0000 mg | ORAL_TABLET | Freq: Every day | ORAL | Status: DC
  Administered 2019-10-08 – 2019-10-11 (×4): 40 mg via ORAL
  Filled 2019-10-08 (×4): qty 2

## 2019-10-08 MED ORDER — METOPROLOL TARTRATE 12.5 MG HALF TABLET
12.5000 mg | ORAL_TABLET | Freq: Four times a day (QID) | ORAL | Status: DC
  Administered 2019-10-08 – 2019-10-09 (×2): 12.5 mg via ORAL
  Filled 2019-10-08 (×2): qty 1

## 2019-10-08 MED ORDER — CITALOPRAM HYDROBROMIDE 10 MG PO TABS
20.0000 mg | ORAL_TABLET | Freq: Every day | ORAL | Status: DC
  Administered 2019-10-09 – 2019-10-12 (×4): 20 mg via ORAL
  Filled 2019-10-08 (×4): qty 2

## 2019-10-08 MED ORDER — LEVOTHYROXINE SODIUM 75 MCG PO TABS
75.0000 ug | ORAL_TABLET | Freq: Every morning | ORAL | Status: DC
  Administered 2019-10-09 – 2019-10-12 (×4): 75 ug via ORAL
  Filled 2019-10-08 (×4): qty 1

## 2019-10-08 MED ORDER — TRAZODONE HCL 50 MG PO TABS
50.0000 mg | ORAL_TABLET | Freq: Every day | ORAL | Status: DC
  Administered 2019-10-08 – 2019-10-11 (×4): 50 mg via ORAL
  Filled 2019-10-08 (×4): qty 1

## 2019-10-08 MED ORDER — CITALOPRAM HYDROBROMIDE 10 MG PO TABS
40.0000 mg | ORAL_TABLET | Freq: Every day | ORAL | Status: DC
  Administered 2019-10-08: 40 mg via ORAL
  Filled 2019-10-08: qty 4

## 2019-10-08 NOTE — Consult Note (Addendum)
CARDIOLOGY CONSULT NOTE  Patient ID: Alyssa Small MRN: [id number] DOB/AGE: Oct 18, 1945 74 y.o.  Admit date: 10/07/2019 Primary Cardiologist None Chief Complaint  AF with RVR Requesting  Neurology  HPI:  Alyssa Small is a 74 y.o. female with history of previous stroke, hypertension, and GERD who was admitted to the Neurology service on 10/07/19 for management of acute ischemic stroke after family noticed a new left sided facial droop with left sided weakness. She was subsequently found to have a small scatter infarcts along the R MCA territory; he did not receive TPA, as she was out of the window.  Fortunately, Alyssa Small stroke symptoms resolved rather quickly and she is asymptomatic from that standpoint now.  The evening of 8/10, Alyssa Small developed rapid tachycardia to the 150s with subsequent ECG confirming atrial fibrillation with rapid ventricular response.  It sounds like she was relatively asymptomatic and that her tachycardia was discovered during routine vital checks.  She was given 5mg  IV Metoprolol. Cardiology was consulted for assistance and management.    At the time of my interview, patient states that she is comfortable and denies any associated chest pain, exertional chest pressure/discomfort, dyspnea/tachypnea, paroxysmal nocturnal dyspnea/orthopnea, irregular heart beat/palpitations, presyncope/syncope, lower extremity edema, claudication, or abdominal distention.   She denies a previous history of atrial fibrillation.  She denies any previous history of palpitations/fluttering/rapid heart.  She states that prior to her current hospitalization she was relatively healthy and not limited from an exertion standpoint. She denies active tobacco, alcohol, or illicit drug use.  Past Medical History:  Diagnosis Date   Arthritis    GERD (gastroesophageal reflux disease)    Hypertension    Stroke (HCC)    2007 no deficits     History reviewed. No pertinent surgical history.    Allergies  Allergen Reactions   Atorvastatin Other (See Comments)    Muscle spasom Muscle spasom    Zolpidem Other (See Comments)    confusion confusion    Ceftriaxone Rash    Other reaction(s): Rash Redness  Redness    Medications Prior to Admission  Medication Sig Dispense Refill Last Dose   albuterol (VENTOLIN HFA) 108 (90 Base) MCG/ACT inhaler Inhale 2 puffs into the lungs every 4 (four) hours as needed for shortness of breath.   10/07/2019   aspirin EC 81 MG tablet Take 81 mg by mouth daily. Swallow whole.   10/07/2019 at Unknown time   citalopram (CELEXA) 40 MG tablet Take 40 mg by mouth daily.   10/07/2019   fluticasone (FLONASE) 50 MCG/ACT nasal spray Place 1 spray into both nostrils daily as needed for allergies.   unk   levothyroxine (SYNTHROID) 75 MCG tablet Take 75 mcg by mouth every morning.   10/07/2019   metoprolol succinate (TOPROL-XL) 25 MG 24 hr tablet Take 25 mg by mouth daily.   10/07/2019 at 0730   omeprazole (PRILOSEC) 20 MG capsule Take 20 mg by mouth daily.   10/07/2019   simvastatin (ZOCOR) 20 MG tablet Take 20 mg by mouth at bedtime.   10/06/2019   traZODone (DESYREL) 50 MG tablet Take 50 mg by mouth at bedtime.   10/06/2019 at Unknown time   Vitamin D, Ergocalciferol, (DRISDOL) 1.25 MG (50000 UNIT) CAPS capsule Take 50,000 Units by mouth every 14 (fourteen) days.   unk   Family History  Problem Relation Age of Onset   Hypertension Mother    Hypertension Father     Social History   Socioeconomic History   Marital  status: Unknown    Spouse name: Not on file   Number of children: Not on file   Years of education: Not on file   Highest education level: Not on file  Occupational History   Not on file  Tobacco Use   Smoking status: Never Smoker   Smokeless tobacco: Never Used  Substance and Sexual Activity   Alcohol use: Never   Drug use: Never   Sexual activity: Not on file  Other Topics Concern   Not on file  Social  History Narrative   Not on file   Social Determinants of Health   Financial Resource Strain:    Difficulty of Paying Living Expenses:   Food Insecurity:    Worried About Running Out of Food in the Last Year:    Barista in the Last Year:   Transportation Needs:    Freight forwarder (Medical):    Lack of Transportation (Non-Medical):   Physical Activity:    Days of Exercise per Week:    Minutes of Exercise per Session:   Stress:    Feeling of Stress :   Social Connections:    Frequency of Communication with Friends and Family:    Frequency of Social Gatherings with Friends and Family:    Attends Religious Services:    Active Member of Clubs or Organizations:    Attends Engineer, structural:    Marital Status:   Intimate Partner Violence:    Fear of Current or Ex-Partner:    Emotionally Abused:    Physically Abused:    Sexually Abused:      Review of Systems: [y] = yes, [ ]  = no       General: Weight gain [] ; Weight loss [ ] ; Anorexia [ ] ; Fatigue [ ] ; Fever [ ] ; Chills [ ] ; Weakness [ ]     Cardiac: Chest pain/pressure [ ] ; Resting SOB [ ] ; Exertional SOB [ ] ; Orthopnea [ ] ; Pedal Edema [ ] ; Palpitations [ ] ; Syncope [ ] ; Presyncope [ ] ; Paroxysmal nocturnal dyspnea[ ]     Pulmonary: Cough [ ] ; Wheezing[ ] ; Hemoptysis[ ] ; Sputum [ ] ; Snoring [ ]     GI: Vomiting[ ] ; Dysphagia[ ] ; Melena[ ] ; Hematochezia [ ] ; Heartburn[ ] ; Abdominal pain [ ] ; Constipation [ ] ; Diarrhea [ ] ; BRBPR [ ]     GU: Hematuria[ ] ; Dysuria [ ] ; Nocturia[ ]   Vascular: Pain in legs with walking [ ] ; Pain in feet with lying flat [ ] ; Non-healing sores [ ] ; Stroke [ ] ; TIA [ ] ; Slurred speech [ ] ;    Neuro: Headaches[ ] ; Vertigo[ ] ; Seizures[ ] ; Paresthesias[ ] ;Blurred vision [ ] ; Diplopia [ ] ; Vision changes [ ]     Ortho/Skin: Arthritis [ ] ; Joint pain [ ] ; Muscle pain [ ] ; Joint swelling [ ] ; Back Pain [ ] ; Rash [ Y]    Psych: Depression[ ] ; Anxiety[ ]      Heme: Bleeding problems [ ] ; Clotting disorders [ ] ; Anemia [ ]     Endocrine: Diabetes [ ] ; Thyroid dysfunction[ ]   Physical Exam: Blood pressure 133/67, pulse (!) 148, temperature 100.3 F (37.9 C), temperature source Oral, resp. rate 20, SpO2 98 %.    GENERAL: Patient is afebrile, Vital signs reviewed, Well appearing, Patient appears comfortable, Alert and lucid. EYES: Normal inspection. HEENT:  normocephalic, atraumatic. CARD: Irregularly irregular rhythm, tachycardic, heart sounds normal. RESP:  no respiratory distress, breath sounds normal. ABD: soft, nontender to palpation, nondistended, BS present. MUSC:  WWP, trace pedal  edema . SKIN: Widespread erythema noted of patient's face, chest , and upper arms NEURO: awake & alert, lucid, grossly intact PSYCH: mood/affect normal.   Labs: Lab Results  Component Value Date   BUN 10 10/07/2019   Lab Results  Component Value Date   CREATININE 0.90 10/07/2019   Lab Results  Component Value Date   NA 142 10/07/2019   K 3.5 10/07/2019   CL 103 10/07/2019   CO2 25 10/07/2019   No results found for: TROPONINI Lab Results  Component Value Date   WBC 7.0 10/07/2019   HGB 14.3 10/07/2019   HCT 42.0 10/07/2019   MCV 90.9 10/07/2019   PLT 340 10/07/2019   Lab Results  Component Value Date   CHOL 171 10/08/2019   HDL 81 10/08/2019   LDLCALC 81 10/08/2019   TRIG 43 10/08/2019   CHOLHDL 2.1 10/08/2019   Lab Results  Component Value Date   ALT 12 10/07/2019   AST 25 10/07/2019   ALKPHOS 80 10/07/2019   BILITOT 0.9 10/07/2019      Radiology: None recent  EKG: AF with RVR with rates in the mid 140s; mild ST depression in the inferior and lateral precordial leads  TTE (10/08/19) 1. Left ventricular ejection fraction, by estimation, is 60 to 65%. The  left ventricle has normal function. The left ventricle has no regional  wall motion abnormalities. Left ventricular diastolic parameters are  consistent with Grade II  diastolic dysfunction (pseudonormalization). Elevated left atrial pressure.  2. Right ventricular systolic function is normal. The right ventricular  size is normal.  3. The mitral valve is normal in structure. No evidence of mitral valve  regurgitation. No evidence of mitral stenosis.  4. The aortic valve is normal in structure. Aortic valve regurgitation is  not visualized. No aortic stenosis is present.  5. The inferior vena cava is normal in size with greater than 50%  respiratory variability, suggesting right atrial pressure of 3 mmHg.   MRI Brain wo Contrast (10/07/19) Few small foci of scattered patchy infarction in the right MCA territory as noted above, most notable along the deep insular cortex, right lateral temporal cortex, and right parietal deep and subcortical white matter. No large confluent acute infarction. No mass effect or evidence of parenchymal hemorrhage. One can appreciate signal loss associated with the occluded right MCA branch in the insula on the susceptibility weighted imaging.  Old left cerebellar infarction. Chronic small-vessel ischemic changes of the cerebral hemispheric white matter.    ASSESSMENT AND PLAN:  Alyssa Small is a 74 y.o. female with history of stroke, hypertension, and GERD who was admitted to the Neurology service on 10/07/19 for management of acute ischemic stroke. Cardiology is consulted for assistance and management of new onset atrial fibrillation with RVR.    # AF with RVR (CHADSVASC = 6) # Acute Ischemic Stroke # History of Prior CVA # HTN  :: No previous history of AF and currently asymptomatic despite HRs in the 140s. Query whether silent AF is the underlying etiology of her recurrent CVAs.  - Will add fractionated Metoprolol 12.5mg  Q6 hr for rate control  - Can uptitrate this as tolerated from BP standpoint. Plan on consolidating dosage closer to discharge  - Attempted diltiazem gtt on top of PO Metoprolol, but she did not  tolerate this from a BP standpoint, so this was discontinued - Currently on ASA + DAPT for secondary prevention of ischemic stroke, but given her AF an oral anticoagulant is therapeutically indicated  -  She has major no risk factors of intracranial hemorrhage (large infarct territory, uncontrolled HTN, evidence of symptomatic hemorrhagic conversion)  - Would recommend starting Apixaban 5mg  BID (in place of Plavix) - Continue to monitor on telemetry - Check thyroid studies - Ensure electrolytes are replete; K > 4 and Mg > 2   Signed: Ralene OkFrancis Runell Kovich 10/08/2019, 10:26 PM

## 2019-10-08 NOTE — Progress Notes (Signed)
  Echocardiogram 2D Echocardiogram has been performed.  Alyssa Small 10/08/2019, 11:13 AM

## 2019-10-08 NOTE — Evaluation (Signed)
Speech Language Pathology Evaluation Patient Details Name: Alyssa Small MRN: [id number] DOB: 10-02-1945 Today's Date: 10/08/2019 Time: 1443-1540 SLP Time Calculation (min) (ACUTE ONLY): 11 min  Problem List:  Patient Active Problem List   Diagnosis Date Noted  . Stroke (cerebrum) (HCC) 10/07/2019   Past Medical History:  Past Medical History:  Diagnosis Date  . Arthritis   . GERD (gastroesophageal reflux disease)   . Hypertension   . Stroke [facility])    2007 no deficits    Past Surgical History: History reviewed. No pertinent surgical history. HPI:  Pt is a 74 yo female presenting with fluctuating L facial droop and L inattention. CT negative, old L cerebellar infarct. CTA reveals M2 MCA branch proximal occlusion. MRI reveals scattered patchy inarction in the R MCA, most notable along the deep insular cortex, right lateral temporal cortex, and right parietal deep and subcortical white matter. PMH: CVA, HTN, GERD   Assessment / Plan / Recommendation Clinical Impression  Pt presents with cognitive impairments, scoring 18/30 on the SLUMS (score <27 indicative of impairment) and commenting that the test was much harder for her than she would have expected it to be at her baseline. Of note, she does seem to have some assistance at baseline from her niece (particularly with money management) and she believes she would have additional assistance from her niece and her son upon return home. Pt today exhibits difficulties with selective attention, storage/recall, problem solving, and planning. She shows vague intellectual and emergent awareness throughout testing, but needs more cueing from SLP when discussing discharge planning targeting her anticipatory awareness. She will benefit from ongoing SLP f/u acutely and post-discharge.     SLP Assessment  SLP Recommendation/Assessment: Patient needs continued Speech Lanaguage Pathology Services SLP Visit Diagnosis: Cognitive communication deficit  (R41.841)    Follow Up Recommendations  Home health SLP;24 hour supervision/assistance    Frequency and Duration min 2x/week  2 weeks      SLP Evaluation Cognition  Overall Cognitive Status: Impaired/Different from baseline Arousal/Alertness: Awake/alert Orientation Level: Oriented X4 Attention: Selective Selective Attention: Impaired Selective Attention Impairment: Verbal basic;Functional basic Memory: Impaired Memory Impairment: Storage deficit;Retrieval deficit;Decreased recall of new information Awareness: Impaired Awareness Impairment: Emergent impairment;Anticipatory impairment Problem Solving: Impaired Problem Solving Impairment: Functional complex;Verbal basic Safety/Judgment: Impaired       Comprehension  Auditory Comprehension Overall Auditory Comprehension: Appears within functional limits for tasks assessed    Expression Expression Primary Mode of Expression: Verbal Verbal Expression Overall Verbal Expression: Appears within functional limits for tasks assessed Written Expression Dominant Hand: Right   Oral / Motor  Oral Motor/Sensory Function Overall Oral Motor/Sensory Function: Mild impairment Facial ROM: Reduced left;Suspected CN VII (facial) dysfunction Facial Symmetry: Abnormal symmetry left;Suspected CN VII (facial) dysfunction Facial Strength: Suspected CN VII (facial) dysfunction;Reduced left Facial Sensation: Reduced left;Suspected CN V (Trigeminal) dysfunction Lingual ROM: Within Functional Limits Lingual Symmetry: Within Functional Limits Lingual Strength: Within Functional Limits Velum: Within Functional Limits Mandible: Within Functional Limits Motor Speech Overall Motor Speech: Appears within functional limits for tasks assessed   GO                    Mahala Menghini., M.A. CCC-SLP Acute Rehabilitation Services Pager [phone] Office [phone]  10/08/2019, 9:38 AM

## 2019-10-08 NOTE — Evaluation (Signed)
Clinical/Bedside Swallow Evaluation Patient Details  Name: Alyssa Small MRN: [id number] Date of Birth: [birth year]  Today's Date: 10/08/2019 Time: SLP Start Time (ACUTE ONLY): 5427 SLP Stop Time (ACUTE ONLY): 0910 SLP Time Calculation (min) (ACUTE ONLY): 18 min  Past Medical History:  Past Medical History:  Diagnosis Date  . Arthritis   . GERD (gastroesophageal reflux disease)   . Hypertension   . Stroke [facility])    2007 no deficits    Past Surgical History: History reviewed. No pertinent surgical history. HPI:  Pt is a 74 yo female presenting with fluctuating L facial droop and L inattention. CT negative, old L cerebellar infarct. CTA reveals M2 MCA branch proximal occlusion. MRI reveals scattered patchy inarction in the R MCA, most notable along the deep insular cortex, right lateral temporal cortex, and right parietal deep and subcortical white matter. PMH: CVA, HTN, GERD   Assessment / Plan / Recommendation Clinical Impression  Pt has mild L facial weakness with suspected sensory impairments as well. She denies  Any changes in direct testing, but there is L-sided spillage with all consistencies with reduced pt awareness. She notes when thin liquids via cup spill, but only when they reach all the way to her chest. Additional signs of suspected reduced control include immediate coughing after cup sip of thin liquid. Pt had reduced anterior loss with straw sips of water and no overt coughing, including challenging with three consecutive ounces. Intermittent throat clearing is noted before, during, and after POs and is a chronic habit from her GERD per pt report. Min cues were provided across solid trials for use of lingual sweep and monitoring of anterior loss. Recommend starting Dys 3 diet and thin liquids via straw. Will f/u for tolerance.  SLP Visit Diagnosis: Dysphagia, unspecified (R13.10)    Aspiration Risk  Mild aspiration risk    Diet Recommendation Dysphagia 3 (Mech soft);Thin  liquid   Liquid Administration via: Straw Medication Administration: Whole meds with puree Supervision: Patient able to self feed;Intermittent supervision to cue for compensatory strategies Compensations: Small sips/bites;Slow rate;Lingual sweep for clearance of pocketing Postural Changes: Seated upright at 90 degrees;Remain upright for at least 30 minutes after po intake    Other  Recommendations Oral Care Recommendations: Oral care BID   Follow up Recommendations Home health SLP;24 hour supervision/assistance      Frequency and Duration min 2x/week  2 weeks       Prognosis Prognosis for Safe Diet Advancement: Good Barriers to Reach Goals: Cognitive deficits      Swallow Study   General HPI: Pt is a 74 yo female presenting with fluctuating L facial droop and L inattention. CT negative, old L cerebellar infarct. CTA reveals M2 MCA branch proximal occlusion. MRI reveals scattered patchy inarction in the R MCA, most notable along the deep insular cortex, right lateral temporal cortex, and right parietal deep and subcortical white matter. PMH: CVA, HTN, GERD Type of Study: Bedside Swallow Evaluation Previous Swallow Assessment: none in chart Diet Prior to this Study: NPO Temperature Spikes Noted: No Respiratory Status: Room air History of Recent Intubation: No Behavior/Cognition: Alert;Cooperative;Pleasant mood;Requires cueing Oral Cavity Assessment: Within Functional Limits Oral Care Completed by SLP: Recent completion by staff Oral Cavity - Dentition: Adequate natural dentition Vision: Functional for self-feeding Self-Feeding Abilities: Able to feed self Patient Positioning: Upright in chair Baseline Vocal Quality: Normal Volitional Cough: Strong Volitional Swallow: Able to elicit    Oral/Motor/Sensory Function Overall Oral Motor/Sensory Function: Mild impairment Facial ROM: Reduced left;Suspected CN VII (  facial) dysfunction Facial Symmetry: Abnormal symmetry left;Suspected  CN VII (facial) dysfunction Facial Strength: Suspected CN VII (facial) dysfunction;Reduced left Facial Sensation: Reduced left;Suspected CN V (Trigeminal) dysfunction Lingual ROM: Within Functional Limits Lingual Symmetry: Within Functional Limits Lingual Strength: Within Functional Limits Velum: Within Functional Limits Mandible: Within Functional Limits   Ice Chips Ice chips: Not tested   Thin Liquid Thin Liquid: Impaired Presentation: Cup;Self Fed;Straw Oral Phase Impairments: Reduced labial seal Oral Phase Functional Implications: Left anterior spillage Pharyngeal  Phase Impairments: Cough - Immediate    Nectar Thick Nectar Thick Liquid: Not tested   Honey Thick Honey Thick Liquid: Not tested   Puree Puree: Impaired Presentation: Self Fed;Spoon Oral Phase Impairments: Reduced labial seal Oral Phase Functional Implications: Left anterior spillage   Solid     Solid: Impaired Presentation: Self Fed Oral Phase Impairments: Reduced labial seal Oral Phase Functional Implications: Oral residue;Left anterior spillage      Mahala Menghini., M.A. CCC-SLP Acute Rehabilitation Services Pager [phone] Office [phone]  10/08/2019,9:30 AM

## 2019-10-08 NOTE — Progress Notes (Signed)
STROKE TEAM PROGRESS NOTE   SUBJECTIVE (INTERVAL HISTORY) Her RN and echo tech are at the bedside.  Overall her condition is completely resolved.  She is neuro intact.  However, MRI showed scattered small right MCA infarcts.  Discussed supple loop recorder, she is in agreement to proceed tomorrow.   OBJECTIVE Temp:  [98.2 F (36.8 C)-100.1 F (37.8 C)] 99.1 F (37.3 C) (08/10 1200) Pulse Rate:  [64-94] 79 (08/10 1000) Resp:  [16-24] 19 (08/10 1000) BP: (114-164)/(53-110) 129/58 (08/10 1000) SpO2:  [90 %-99 %] 97 % (08/10 1000)  Recent Labs  Lab 10/07/19 1628  GLUCAP 100*   Recent Labs  Lab 10/07/19 1301 10/07/19 1309  NA 140 142  K 4.0 3.5  CL 104 103  CO2 25  --   GLUCOSE 107* 106*  BUN 9 10  CREATININE 0.94 0.90  CALCIUM 9.4  --    Recent Labs  Lab 10/07/19 1301  AST 25  ALT 12  ALKPHOS 80  BILITOT 0.9  PROT 7.1  ALBUMIN 3.6   Recent Labs  Lab 10/07/19 1301 10/07/19 1309  WBC 7.0  --   NEUTROABS 4.2  --   HGB 13.6 14.3  HCT 41.2 42.0  MCV 90.9  --   PLT 340  --    No results for input(s): CKTOTAL, CKMB, CKMBINDEX, TROPONINI in the last 168 hours. Recent Labs    10/07/19 1301  LABPROT 12.9  INR 1.0   No results for input(s): COLORURINE, LABSPEC, PHURINE, GLUCOSEU, HGBUR, BILIRUBINUR, KETONESUR, PROTEINUR, UROBILINOGEN, NITRITE, LEUKOCYTESUR in the last 72 hours.  Invalid input(s): APPERANCEUR     Component Value Date/Time   CHOL 171 10/08/2019 0433   TRIG 43 10/08/2019 0433   HDL 81 10/08/2019 0433   CHOLHDL 2.1 10/08/2019 0433   VLDL 9 10/08/2019 0433   LDLCALC 81 10/08/2019 0433   Lab Results  Component Value Date   HGBA1C 5.5 10/08/2019   No results found for: LABOPIA, COCAINSCRNUR, LABBENZ, AMPHETMU, THCU, LABBARB  No results for input(s): ETH in the last 168 hours.  I have personally reviewed the radiological images below and agree with the radiology interpretations.  DG Chest 2 View  Result Date: 10/07/2019 CLINICAL DATA:   Stroke. EXAM: CHEST - 2 VIEW COMPARISON:  11/02/2014 FINDINGS: Normal sized heart. Clear lungs with normal vascularity. Thoracic spine degenerative changes. Diffuse osteopenia. IMPRESSION: No acute abnormality. Electronically Signed   By: Beckie Salts M.D.   On: 10/07/2019 16:16   CT HEAD WO CONTRAST  Result Date: 10/07/2019 CLINICAL DATA:  Left-sided weakness.  Transient ischemic attack. EXAM: CT HEAD WITHOUT CONTRAST TECHNIQUE: Contiguous axial images were obtained from the base of the skull through the vertex without intravenous contrast. COMPARISON:  CT head 11/01/2014. FINDINGS: Brain: There is no evidence of acute intracranial hemorrhage, mass lesion, brain edema or extra-axial fluid collection. Old inferior left cerebellar infarct again noted with associated encephalomalacia. The ventricles and subarachnoid spaces are otherwise appropriately size for age. There is mild asymmetric gliosis in the right occipital white matter. There is no CT evidence of acute cortical infarction. Vascular: Intracranial vascular calcifications. No hyperdense vessel identified. Skull: Negative for fracture or focal lesion. Sinuses/Orbits: The visualized paranasal sinuses and mastoid air cells are clear. No orbital abnormalities are seen. Other: None. IMPRESSION: 1. No acute intracranial findings. 2. Old left cerebellar infarct. Electronically Signed   By: Carey Bullocks M.D.   On: 10/07/2019 13:33   MR BRAIN WO CONTRAST  Result Date: 10/07/2019 CLINICAL DATA:  Left facial droop. Left-sided weakness. Right M2 branch vessel occlusion by CT. EXAM: MRI HEAD WITHOUT CONTRAST TECHNIQUE: Multiplanar, multiecho pulse sequences of the brain and surrounding structures were obtained without intravenous contrast. COMPARISON:  CT studies earlier same day.  MRI 10/30/2014. FINDINGS: Brain: Diffusion imaging shows a few scattered small patchy areas of infarction in the right MCA territory including in the insula, lateral temporal lobe,  and parietal deep and subcortical white matter. No large confluent infarction. Old infarction affects the left cerebellum. Cerebral hemispheres otherwise show moderate chronic small-vessel ischemic changes of the white matter. One can appreciate signal loss in the right MCA branch responsible for the infarction on the susceptibility weighted imaging. No hydrocephalus or extra-axial collection. Vascular: Otherwise the major vessels at the base of the brain show flow. Skull and upper cervical spine: Negative Sinuses/Orbits: Clear/normal Other: None IMPRESSION: Few small foci of scattered patchy infarction in the right MCA territory as noted above, most notable along the deep insular cortex, right lateral temporal cortex, and right parietal deep and subcortical white matter. No large confluent acute infarction. No mass effect or evidence of parenchymal hemorrhage. One can appreciate signal loss associated with the occluded right MCA branch in the insula on the susceptibility weighted imaging. Old left cerebellar infarction. Chronic small-vessel ischemic changes of the cerebral hemispheric white matter. Electronically Signed   By: Paulina Fusi M.D.   On: 10/07/2019 15:59   CT CEREBRAL PERFUSION W CONTRAST  Result Date: 10/07/2019 CLINICAL DATA:  Left-sided weakness EXAM: CT ANGIOGRAPHY HEAD AND NECK CT PERFUSION BRAIN TECHNIQUE: Multidetector CT imaging of the head and neck was performed using the standard protocol during bolus administration of intravenous contrast. Multiplanar CT image reconstructions and MIPs were obtained to evaluate the vascular anatomy. Carotid stenosis measurements (when applicable) are obtained utilizing NASCET criteria, using the distal internal carotid diameter as the denominator. Multiphase CT imaging of the brain was performed following IV bolus contrast injection. Subsequent parametric perfusion maps were calculated using RAPID software. CONTRAST:  80mL OMNIPAQUE IOHEXOL 350 MG/ML SOLN  COMPARISON:  None. FINDINGS: CTA NECK FINDINGS Aortic arch: Great vessel origins are patent. Right carotid system: Patent. Partially retropharyngeal course of the ICA. No measurable stenosis. Left carotid system: Patent. Partially retropharyngeal course of the ICA. No measurable stenosis. Vertebral arteries: Patent and codominant. Skeleton: Cervical spine degenerative changes. Other neck: No mass or adenopathy. Upper chest: Included upper lungs are clear. Review of the MIP images confirms the above findings CTA HEAD FINDINGS Anterior circulation: Intracranial internal carotid arteries are patent with mild calcified plaque. There is a 1 mm inferiorly directed outpouching of the distal supraclinoid left ICA. Appears to be a diminutive branch arising from this area. Right M1 MCA is patent. There is occlusion of a proximal right M2 MCA branch with some reconstitution more distally. Left middle and both anterior cerebral arteries are patent. An anterior communicating artery is present. Posterior circulation: Intracranial vertebral arteries, basilar artery, and posterior cerebral arteries are patent Venous sinuses: As permitted by contrast timing, patent. Review of the MIP images confirms the above findings CT Brain Perfusion Findings: ASPECTS: 10 CBF (<30%) Volume: 0mL Perfusion (Tmax>6.0s) volume: 33mL Mismatch Volume: 33mL Infarction Location:Posterior right MCA territory. IMPRESSION: There is occlusion of a proximal right M2 MCA branch with some reconstitution more distally. Perfusion imaging demonstrates no evidence of core infarction. However, there is 33 mL of penumbra identified in the posterior right MCA territory. No measurable stenosis in the neck. 1 mm outpouching of the distal supraclinoid  left ICA likely reflecting an infundibulum. These results were called by telephone at the time of interpretation on 10/07/2019 at 1:51 pm to provider Dr. Iver Nestle, who verbally acknowledged these results. Electronically Signed    By: Guadlupe Spanish M.D.   On: 10/07/2019 14:01   ECHOCARDIOGRAM COMPLETE  Result Date: 10/08/2019    ECHOCARDIOGRAM REPORT   Patient Name:   DANNI SHIMA Date of Exam: 10/08/2019 Medical Rec #:  [id number]    Height: Accession #:    [id number]   Weight: Date of Birth:  1945-07-27    BSA: Patient Age:    74 years     BP:           129/58 mmHg Patient Gender: F            HR:           80 bpm. Exam Location:  Inpatient Procedure: 2D Echo Indications:   stroke 434.91  History:       Patient has no prior history of Echocardiogram examinations.                Stroke.  Sonographer:   Delcie Roch Referring      [phone] DAVID R SMITH Phys: IMPRESSIONS  1. Left ventricular ejection fraction, by estimation, is 60 to 65%. The left ventricle has normal function. The left ventricle has no regional wall motion abnormalities. Left ventricular diastolic parameters are consistent with Grade II diastolic dysfunction (pseudonormalization). Elevated left atrial pressure.  2. Right ventricular systolic function is normal. The right ventricular size is normal.  3. The mitral valve is normal in structure. No evidence of mitral valve regurgitation. No evidence of mitral stenosis.  4. The aortic valve is normal in structure. Aortic valve regurgitation is not visualized. No aortic stenosis is present.  5. The inferior vena cava is normal in size with greater than 50% respiratory variability, suggesting right atrial pressure of 3 mmHg. FINDINGS  Left Ventricle: Left ventricular ejection fraction, by estimation, is 60 to 65%. The left ventricle has normal function. The left ventricle has no regional wall motion abnormalities. The left ventricular internal cavity size was normal in size. There is  no left ventricular hypertrophy. Left ventricular diastolic parameters are consistent with Grade II diastolic dysfunction (pseudonormalization). Elevated left atrial pressure. Right Ventricle: The right ventricular size is normal. No increase in  right ventricular wall thickness. Right ventricular systolic function is normal. Left Atrium: Left atrial size was normal in size. Right Atrium: Right atrial size was normal in size. Pericardium: There is no evidence of pericardial effusion. Mitral Valve: The mitral valve is normal in structure. Normal mobility of the mitral valve leaflets. No evidence of mitral valve regurgitation. No evidence of mitral valve stenosis. Tricuspid Valve: The tricuspid valve is normal in structure. Tricuspid valve regurgitation is not demonstrated. No evidence of tricuspid stenosis. Aortic Valve: The aortic valve is normal in structure. Aortic valve regurgitation is not visualized. No aortic stenosis is present. Pulmonic Valve: The pulmonic valve was normal in structure. Pulmonic valve regurgitation is not visualized. No evidence of pulmonic stenosis. Aorta: The aortic root is normal in size and structure. Venous: The inferior vena cava is normal in size with greater than 50% respiratory variability, suggesting right atrial pressure of 3 mmHg. IAS/Shunts: No atrial level shunt detected by color flow Doppler.  LEFT VENTRICLE PLAX 2D LVIDd:         3.90 cm  Diastology LVIDs:         2.80 cm  LV e' lateral:   10.40 cm/s LV PW:         0.90 cm  LV E/e' lateral: 11.3 LV IVS:        0.70 cm  LV e' medial:    7.40 cm/s LVOT diam:     1.80 cm  LV E/e' medial:  15.9 LV SV:         64 LVOT Area:     2.54 cm  RIGHT VENTRICLE             IVC RV S prime:     17.10 cm/s  IVC diam: 1.20 cm TAPSE (M-mode): 2.5 cm LEFT ATRIUM             RIGHT ATRIUM LA diam:        3.30 cm RA Area:     10.30 cm LA Vol (A2C):   38.4 ml RA Volume:   21.30 ml LA Vol (A4C):   44.2 ml LA Biplane Vol: 42.5 ml  AORTIC VALVE LVOT Vmax:   132.00 cm/s LVOT Vmean:  79.200 cm/s LVOT VTI:    0.250 m  AORTA Ao Root diam: 3.10 cm Ao Asc diam:  3.00 cm MITRAL VALVE MV Area (PHT): 3.65 cm     SHUNTS MV Decel Time: 208 msec     Systemic VTI:  0.25 m MV E velocity: 118.00 cm/s   Systemic Diam: 1.80 cm MV A velocity: 88.60 cm/s MV E/A ratio:  1.33 Mihai Croitoru MD Electronically signed by Thurmon Fair MD Signature Date/Time: 10/08/2019/11:42:35 AM    Final    CT ANGIO HEAD CODE STROKE  Result Date: 10/07/2019 CLINICAL DATA:  Left-sided weakness EXAM: CT ANGIOGRAPHY HEAD AND NECK CT PERFUSION BRAIN TECHNIQUE: Multidetector CT imaging of the head and neck was performed using the standard protocol during bolus administration of intravenous contrast. Multiplanar CT image reconstructions and MIPs were obtained to evaluate the vascular anatomy. Carotid stenosis measurements (when applicable) are obtained utilizing NASCET criteria, using the distal internal carotid diameter as the denominator. Multiphase CT imaging of the brain was performed following IV bolus contrast injection. Subsequent parametric perfusion maps were calculated using RAPID software. CONTRAST:  80mL OMNIPAQUE IOHEXOL 350 MG/ML SOLN COMPARISON:  None. FINDINGS: CTA NECK FINDINGS Aortic arch: Great vessel origins are patent. Right carotid system: Patent. Partially retropharyngeal course of the ICA. No measurable stenosis. Left carotid system: Patent. Partially retropharyngeal course of the ICA. No measurable stenosis. Vertebral arteries: Patent and codominant. Skeleton: Cervical spine degenerative changes. Other neck: No mass or adenopathy. Upper chest: Included upper lungs are clear. Review of the MIP images confirms the above findings CTA HEAD FINDINGS Anterior circulation: Intracranial internal carotid arteries are patent with mild calcified plaque. There is a 1 mm inferiorly directed outpouching of the distal supraclinoid left ICA. Appears to be a diminutive branch arising from this area. Right M1 MCA is patent. There is occlusion of a proximal right M2 MCA branch with some reconstitution more distally. Left middle and both anterior cerebral arteries are patent. An anterior communicating artery is present. Posterior  circulation: Intracranial vertebral arteries, basilar artery, and posterior cerebral arteries are patent Venous sinuses: As permitted by contrast timing, patent. Review of the MIP images confirms the above findings CT Brain Perfusion Findings: ASPECTS: 10 CBF (<30%) Volume: 0mL Perfusion (Tmax>6.0s) volume: 33mL Mismatch Volume: 33mL Infarction Location:Posterior right MCA territory. IMPRESSION: There is occlusion of a proximal right M2 MCA branch with some reconstitution more distally. Perfusion imaging demonstrates no evidence of core  infarction. However, there is 33 mL of penumbra identified in the posterior right MCA territory. No measurable stenosis in the neck. 1 mm outpouching of the distal supraclinoid left ICA likely reflecting an infundibulum. These results were called by telephone at the time of interpretation on 10/07/2019 at 1:51 pm to provider Dr. Iver Nestle, who verbally acknowledged these results. Electronically Signed   By: Guadlupe Spanish M.D.   On: 10/07/2019 14:01   CT ANGIO NECK CODE STROKE  Result Date: 10/07/2019 CLINICAL DATA:  Left-sided weakness EXAM: CT ANGIOGRAPHY HEAD AND NECK CT PERFUSION BRAIN TECHNIQUE: Multidetector CT imaging of the head and neck was performed using the standard protocol during bolus administration of intravenous contrast. Multiplanar CT image reconstructions and MIPs were obtained to evaluate the vascular anatomy. Carotid stenosis measurements (when applicable) are obtained utilizing NASCET criteria, using the distal internal carotid diameter as the denominator. Multiphase CT imaging of the brain was performed following IV bolus contrast injection. Subsequent parametric perfusion maps were calculated using RAPID software. CONTRAST:  26mL OMNIPAQUE IOHEXOL 350 MG/ML SOLN COMPARISON:  None. FINDINGS: CTA NECK FINDINGS Aortic arch: Great vessel origins are patent. Right carotid system: Patent. Partially retropharyngeal course of the ICA. No measurable stenosis. Left  carotid system: Patent. Partially retropharyngeal course of the ICA. No measurable stenosis. Vertebral arteries: Patent and codominant. Skeleton: Cervical spine degenerative changes. Other neck: No mass or adenopathy. Upper chest: Included upper lungs are clear. Review of the MIP images confirms the above findings CTA HEAD FINDINGS Anterior circulation: Intracranial internal carotid arteries are patent with mild calcified plaque. There is a 1 mm inferiorly directed outpouching of the distal supraclinoid left ICA. Appears to be a diminutive branch arising from this area. Right M1 MCA is patent. There is occlusion of a proximal right M2 MCA branch with some reconstitution more distally. Left middle and both anterior cerebral arteries are patent. An anterior communicating artery is present. Posterior circulation: Intracranial vertebral arteries, basilar artery, and posterior cerebral arteries are patent Venous sinuses: As permitted by contrast timing, patent. Review of the MIP images confirms the above findings CT Brain Perfusion Findings: ASPECTS: 10 CBF (<30%) Volume: 58mL Perfusion (Tmax>6.0s) volume: 40mL Mismatch Volume: 49mL Infarction Location:Posterior right MCA territory. IMPRESSION: There is occlusion of a proximal right M2 MCA branch with some reconstitution more distally. Perfusion imaging demonstrates no evidence of core infarction. However, there is 33 mL of penumbra identified in the posterior right MCA territory. No measurable stenosis in the neck. 1 mm outpouching of the distal supraclinoid left ICA likely reflecting an infundibulum. These results were called by telephone at the time of interpretation on 10/07/2019 at 1:51 pm to provider Dr. Iver Nestle, who verbally acknowledged these results. Electronically Signed   By: Guadlupe Spanish M.D.   On: 10/07/2019 14:01    PHYSICAL EXAM  Temp:  [98.2 F (36.8 C)-100.1 F (37.8 C)] 99.1 F (37.3 C) (08/10 1200) Pulse Rate:  [64-94] 79 (08/10 1000) Resp:   [16-24] 19 (08/10 1000) BP: (114-164)/(53-110) 129/58 (08/10 1000) SpO2:  [90 %-99 %] 97 % (08/10 1000)  General - Well nourished, well developed, in no apparent distress.  Ophthalmologic - fundi not visualized due to noncooperation.  Cardiovascular - Regular rhythm and rate.  Mental Status -  Level of arousal and orientation to time, place, and person were intact. Language including expression, naming, repetition, comprehension was assessed and found intact.  Cranial Nerves II - XII - II - Visual field intact OU. III, IV, VI - Extraocular movements intact.  V - Facial sensation intact bilaterally. VII - Facial movement intact bilaterally. VIII - Hearing & vestibular intact bilaterally. X - Palate elevates symmetrically. XI - Chin turning & shoulder shrug intact bilaterally. XII - Tongue protrusion intact.  Motor Strength - The patient's strength was normal in all extremities and pronator drift was absent.  Bulk was normal and fasciculations were absent.   Motor Tone - Muscle tone was assessed at the neck and appendages and was normal.  Reflexes - The patient's reflexes were symmetrical in all extremities and she had no pathological reflexes.  Sensory - Light touch, temperature/pinprick were assessed and were symmetrical.    Coordination - The patient had normal movements in the hands with no ataxia or dysmetria.  Tremor was absent.  Gait and Station - deferred.   ASSESSMENT/PLAN Alyssa Small is a 74 y.o. female with history of stroke, hypertension admitted for left facial droop and left neglect. No tPA given due to outside window.    Stroke:  right MCA patchy infarct due to right M2 occlusion, embolic pattern, source unclear  CT head chronic left cerebellar infarct  CTA head and neck right M2 occlusion  CT perfusion no core, 33 cc penumbra  MRI right MCA patchy small infarcts  2D Echo EF 60 to 65%  LE venous Doppler pending  Will do loop recorder if stroke  work-up negative.  LDL 81  HgbA1c 5.5  SCDs for VTE prophylaxis  aspirin 81 mg daily prior to admission, now on aspirin 325 mg daily and clopidogrel 75 mg daily DAPT for 3 months and then Plavix alone.  Patient counseled to be compliant with her antithrombotic medications  Ongoing aggressive stroke risk factor management  Therapy recommendations: Home health PT/OT  Disposition: Pending  Hypertension . Stable . Permissive hypertension (OK if <220/120) for 24-48 hours post stroke and then gradually normalized within 5-7 days.  Long term BP goal normotensive  Hyperlipidemia  Home meds: Zocor 20  LDL 81, goal < 70  Now on Zocor 40  Continue statin at discharge  Other Stroke Risk Factors  Advanced age  Other Active Problems  Depression on Zoloft  Hospital day # 1  This patient is critically ill due to right MCA stroke with right M2 occlusion needed ICU close monitoring in case for thrombectomy and at significant risk of neurological worsening, death form recurrent stroke, hemorrhagic conversion, seizure. This patient's care requires constant monitoring of vital signs, hemodynamics, respiratory and cardiac monitoring, review of multiple databases, neurological assessment, discussion with family, other specialists and medical decision making of high complexity. I spent 30 minutes of neurocritical care time in the care of this patient.  Marvel PlanJindong Kassie Keng, MD PhD Stroke Neurology 10/08/2019 3:31 PM    To contact Stroke Continuity provider, please refer to [email or web address]. After hours, contact General Neurology

## 2019-10-08 NOTE — Progress Notes (Addendum)
The patient is feeling hot and is flushed. The symptoms started the day before yesterday. She received Benadryl about 10 minutes ago. Her HR has been elevated for about 20 minutes and is now in the 150's. She feels mildly anxious. Denies any CP or difficulty breathing. Denies being an alcohol user at home.   BP 133/67 (BP Location: Right Arm)   Pulse (!) 148   Temp 100.3 F (37.9 C) (Oral)   Resp 20   SpO2 98%    Most recent EKG from 8/9: Sinus rhythm Consider left atrial enlargement Prolonged QT interval  DDx: Sinus tachycardia due to anxiety versus arrhythmia.   Obtaining STAT EKG. May need to consult Cardiology.   Addendum: EKG completed.  Atrial fibrillation with RVR.  Marked ST abnormality. Possible inferior subendocardial injury.  Patient continues to be asymptomatic. Will call Cardiology for assistance regarding new onset atrial fibrillation. Metoprolol 5 mg IV x 1 has been ordered.   Addendum: -- HR down to 120 after Metoprolol.  -- Case discussed with Cardiology. Consult by Cardiology team is pending.   Electronically signed: Dr. Caryl Pina

## 2019-10-08 NOTE — Evaluation (Signed)
Physical Therapy Evaluation Patient Details Name: Alyssa Small MRN: [id number] DOB: October 30, 1945 Today's Date: 10/08/2019   History of Present Illness  Pt is a 74 y/o female with PMH of CVA, HTN presenting to ED with facial droop.  In ED initally resolved but noted L sided symptoms again in triage, which resolved again by CT. CT negative, old L cerebellar infarct. CTA reveals M2 MCA branch proximal occlusion. MRI reveals scattered patchy inarction in the R MCA, most notable along the deep insular cortex, right lateral temporal cortex, and right parietal deep and subcortical white matter.    Clinical Impression  Pt admitted with above. Pt functioning near baseline. Pt with decreased activity tolerance and mild unsteadiness with ambulation. Pt now requiring assist tolieting and donning socks. Pt with stool incontinence and noted red rash throughout body especially neck, chest and scalp. Pt with 24/7 assist available at home, good home set up and all DME. Recommend HHPT to progress to safe mod I level of function. Acute PT to cont to follow.    Follow Up Recommendations Home health PT;Supervision/Assistance - 24 hour    Equipment Recommendations  None recommended by PT    Recommendations for Other Services       Precautions / Restrictions Precautions Precautions: Fall Precaution Comments: red/inflamation/rash t/o body Restrictions Weight Bearing Restrictions: No      Mobility  Bed Mobility Overal bed mobility: Needs Assistance Bed Mobility: Rolling;Sidelying to Sit Rolling: Supervision Sidelying to sit: Min guard       General bed mobility comments: use of bed rail, min guard for safety, increased time  Transfers Overall transfer level: Needs assistance Equipment used: Rolling walker (2 wheeled) Transfers: Sit to/from Stand Sit to Stand: Min guard         General transfer comment: min guard for safety/balance, cueing for hand placement   Ambulation/Gait Ambulation/Gait  assistance: Min assist Gait Distance (Feet): 60 Feet Assistive device: Rolling walker (2 wheeled) Gait Pattern/deviations: Step-through pattern;Decreased stride length;Wide base of support Gait velocity: dec Gait velocity interpretation: <1.31 ft/sec, indicative of household ambulator General Gait Details: minA for walker management around obstacles, slow  Stairs            Wheelchair Mobility    Modified Rankin (Stroke Patients Only) Modified Rankin (Stroke Patients Only) Pre-Morbid Rankin Score: Moderate disability Modified Rankin: Moderate disability     Balance Overall balance assessment: Needs assistance Sitting-balance support: No upper extremity supported;Feet supported Sitting balance-Leahy Scale: Fair     Standing balance support: Bilateral upper extremity supported;During functional activity Standing balance-Leahy Scale: Fair Standing balance comment: relies on BUE support dynamically                             Pertinent Vitals/Pain Pain Assessment: No/denies pain    Home Living Family/patient expects to be discharged to:: Private residence Living Arrangements: Other (Comment) (sister and niece, lives in downstairs apartment ) Available Help at Discharge: Family;Available 24 hours/day Type of Home: House Home Access: Level entry     Home Layout: Two level Home Equipment: Walker - 2 wheels;Bedside commode;Grab bars - toilet;Adaptive equipment      Prior Function Level of Independence: Independent with assistive device(s)         Comments: uses walker for mobility, sister supervises tub transfers, uses sock aide for LB dressing, basic IADLs only (no driving)     Hand Dominance   Dominant Hand: Right    Extremity/Trunk Assessment  Upper Extremity Assessment Upper Extremity Assessment: Defer to OT evaluation    Lower Extremity Assessment Lower Extremity Assessment: RLE deficits/detail;LLE deficits/detail RLE Deficits /  Details: grossly 4/5 - but at baseline LLE Deficits / Details: grossly 4-/5    Cervical / Trunk Assessment Cervical / Trunk Assessment: Kyphotic  Communication   Communication: No difficulties  Cognition Arousal/Alertness: Awake/alert Behavior During Therapy: WFL for tasks assessed/performed Overall Cognitive Status: No family/caregiver present to determine baseline cognitive functioning Area of Impairment: Problem solving;Awareness;Attention;Memory;Following commands                     Memory: Decreased short-term memory Following Commands: Follows one step commands consistently;Follows one step commands with increased time   Awareness: Emergent Problem Solving: Slow processing;Requires verbal cues General Comments: pt reports L sided weakness from arthritis      General Comments General comments (skin integrity, edema, etc.): pt with stool incontinence in bed, pt aware, dependent for hygiene. assist to bathroom, unable to wipe, able to don R sock but not L    Exercises     Assessment/Plan    PT Assessment Patient needs continued PT services  PT Problem List Decreased strength;Decreased activity tolerance;Decreased balance;Decreased mobility;Decreased coordination;Decreased knowledge of use of DME       PT Treatment Interventions DME instruction;Gait training;Stair training;Functional mobility training;Therapeutic activities;Therapeutic exercise;Balance training;Neuromuscular re-education    PT Goals (Current goals can be found in the Care Plan section)  Acute Rehab PT Goals Patient Stated Goal: to get home  PT Goal Formulation: With patient Time For Goal Achievement: 10/22/19 Potential to Achieve Goals: Good    Frequency Min 4X/week   Barriers to discharge        Co-evaluation               AM-PAC PT "6 Clicks" Mobility  Outcome Measure Help needed turning from your back to your side while in a flat bed without using bedrails?: None Help needed  moving from lying on your back to sitting on the side of a flat bed without using bedrails?: A Little Help needed moving to and from a bed to a chair (including a wheelchair)?: A Little Help needed standing up from a chair using your arms (e.g., wheelchair or bedside chair)?: A Little Help needed to walk in hospital room?: A Little Help needed climbing 3-5 steps with a railing? : A Lot 6 Click Score: 18    End of Session Equipment Utilized During Treatment: Gait belt Activity Tolerance: Patient tolerated treatment well Patient left: in chair (with OT) Nurse Communication: Mobility status PT Visit Diagnosis: Unsteadiness on feet (R26.81);Difficulty in walking, not elsewhere classified (R26.2)    Time: 6295-2841 PT Time Calculation (min) (ACUTE ONLY): 33 min   Charges:   PT Evaluation $PT Eval Moderate Complexity: 1 Mod PT Treatments $Gait Training: 8-22 mins        Lewis Shock, PT, DPT Acute Rehabilitation Services Pager #: [phone] Office #: [phone]   Iona Hansen 10/08/2019, 2:05 PM

## 2019-10-08 NOTE — Evaluation (Signed)
Occupational Therapy Evaluation Patient Details Name: Alyssa Small MRN: [id number] DOB: 11-26-1945 Today's Date: 10/08/2019    History of Present Illness Pt is a 74 y/o female with PMH of CVA, HTN presenting to ED with facial droop.  In ED initally resolved but noted L sided symptoms again in triage, which resolved again by CT. CT negative, old L cerebellar infarct. CTA reveals M2 MCA branch proximal occlusion. MRI reveals scattered patchy inarction in the R MCA, most notable along the deep insular cortex, right lateral temporal cortex, and right parietal deep and subcortical white matter.   Clinical Impression   PTA patient modified independent using rolling walker for mobility, ADLs with supervision for shower transfers but otherwise modified independent using sock aide; not driving but managing simple meals and meds.  Patient admitted for above and limited by problem list below, including impaired balance, L sided weakness, impaired cognition, decreased activity tolerance.  She follows simple commands, demonstrates decreased awareness, problem solving, and recall during session and would benefit from further functional cognitive assessment.  She requires min guard for basic transfers and in room mobility, min assist to setup for ADLs.  She will benefit from continued OT services while admitted and after dc at [facility] level, given 24/7 initial supervision, to optimize independence, safety and return to PLOF with ADLs.      Follow Up Recommendations  Home health OT;Supervision/Assistance - 24 hour    Equipment Recommendations  None recommended by OT    Recommendations for Other Services       Precautions / Restrictions Precautions Precautions: Fall Precaution Comments: red/inflamation/rash t/o body Restrictions Weight Bearing Restrictions: No      Mobility Bed Mobility               General bed mobility comments: OOB in recliner upon entry   Transfers Overall transfer level:  Needs assistance Equipment used: Rolling walker (2 wheeled) Transfers: Sit to/from Stand Sit to Stand: Min guard         General transfer comment: min guard for safety/balance, cueing for hand placement     Balance Overall balance assessment: Needs assistance Sitting-balance support: No upper extremity supported;Feet supported Sitting balance-Leahy Scale: Fair     Standing balance support: No upper extremity supported;Bilateral upper extremity supported;During functional activity Standing balance-Leahy Scale: Fair Standing balance comment: relies on BUE support dynamically                           ADL either performed or assessed with clinical judgement   ADL Overall ADL's : Needs assistance/impaired     Grooming: Min guard;Standing;Oral care;Wash/dry face Grooming Details (indicate cue type and reason): suction oral care seated, washing face standing at sink  Upper Body Bathing: Set up;Sitting   Lower Body Bathing: Minimal assistance;Sit to/from stand   Upper Body Dressing : Set up;Sitting   Lower Body Dressing: Minimal assistance;Sit to/from stand Lower Body Dressing Details (indicate cue type and reason): requires assist with L sock but normally uses sock aide, able to figure 4 RLE but not LLE due to hip pain   Toilet Transfer: Min guard;Ambulation;RW Toilet Transfer Details (indicate cue type and reason): simulated in room         Functional mobility during ADLs: Min guard;Rolling walker General ADL Comments: pt limited by weakness, L hip pain and impaired balance      Vision Baseline Vision/History: Wears glasses Wears Glasses: At all times Patient Visual Report: No change from baseline  Vision Assessment?: Yes Eye Alignment: Within Functional Limits Ocular Range of Motion: Within Functional Limits Alignment/Gaze Preference: Within Defined Limits Tracking/Visual Pursuits: Able to track stimulus in all quads without difficulty Visual Fields: No  apparent deficits Additional Comments: no apparent visual deficits      Perception     Praxis      Pertinent Vitals/Pain Pain Assessment: Faces Faces Pain Scale: Hurts little more Pain Location: L hip Pain Descriptors / Indicators: Sore Pain Intervention(s): Limited activity within patient's tolerance;Monitored during session;Repositioned     Hand Dominance Right   Extremity/Trunk Assessment Upper Extremity Assessment Upper Extremity Assessment: LUE deficits/detail LUE Deficits / Details: 3/5 MMT grossly, able to use functionally LUE Sensation: WNL LUE Coordination: WNL   Lower Extremity Assessment Lower Extremity Assessment: Defer to PT evaluation       Communication Communication Communication: No difficulties   Cognition Arousal/Alertness: Awake/alert Behavior During Therapy: WFL for tasks assessed/performed Overall Cognitive Status: Impaired/Different from baseline Area of Impairment: Problem solving;Awareness;Attention;Memory;Following commands                   Current Attention Level: Sustained Memory: Decreased short-term memory;Decreased recall of precautions Following Commands: Follows one step commands consistently;Follows one step commands with increased time;Follows multi-step commands inconsistently   Awareness: Emergent Problem Solving: Slow processing;Requires verbal cues General Comments: decreased awareness of some L sided weakness, some slow processing and decreased recall/attention to task, perseverates on wanting water during session-- further assessment recommended    General Comments  pt perseverating on wanting water to drink, SLP present and in to assess after session     Exercises     Shoulder Instructions      Home Living Family/patient expects to be discharged to:: Private residence Living Arrangements: Other (Comment) (sister and niece, lives in downstairs apartment ) Available Help at Discharge: Family;Available 24  hours/day Type of Home: House Home Access: Level entry     Home Layout: Two level Alternate Level Stairs-Number of Steps: flight- has to do 3 stairs to get into lift chair Alternate Level Stairs-Rails: Can reach both (and lift chair ) Bathroom Shower/Tub: Tub/shower unit   Bathroom Toilet: Handicapped height     Home Equipment: Environmental consultant - 2 wheels;Bedside commode;Grab bars - toilet;Adaptive equipment Adaptive Equipment: Sock aid        Prior Functioning/Environment Level of Independence: Independent with assistive device(s)        Comments: uses walker for mobility, sister supervises tub transfers, uses sock aide for LB dressing, basic IADLs only (no driving)        OT Problem List: Decreased strength;Decreased activity tolerance;Impaired balance (sitting and/or standing);Decreased coordination;Decreased safety awareness;Pain;Decreased knowledge of precautions;Decreased knowledge of use of DME or AE;Decreased cognition      OT Treatment/Interventions: Self-care/ADL training;Therapeutic exercise;DME and/or AE instruction;Patient/family education;Balance training;Cognitive remediation/compensation;Therapeutic activities;Neuromuscular education    OT Goals(Current goals can be found in the care plan section) Acute Rehab OT Goals Patient Stated Goal: to get home  OT Goal Formulation: With patient Time For Goal Achievement: 10/22/19 Potential to Achieve Goals: Good  OT Frequency: Min 2X/week   Barriers to D/C:            Co-evaluation              AM-PAC OT "6 Clicks" Daily Activity     Outcome Measure Help from another person eating meals?: Total (NPO) Help from another person taking care of personal grooming?: A Little Help from another person toileting, which includes using toliet, bedpan, or  urinal?: A Lot Help from another person bathing (including washing, rinsing, drying)?: A Little Help from another person to put on and taking off regular upper body  clothing?: A Little Help from another person to put on and taking off regular lower body clothing?: A Little 6 Click Score: 15   End of Session Equipment Utilized During Treatment: Gait belt;Rolling walker Nurse Communication: Mobility status  Activity Tolerance: Patient tolerated treatment well Patient left: in chair;with call bell/phone within reach;with chair alarm set  OT Visit Diagnosis: Other abnormalities of gait and mobility (R26.89);Pain;Muscle weakness (generalized) (M62.81) Pain - Right/Left: Left Pain - part of body: Hip                Time: 1607-3710 OT Time Calculation (min): 26 min Charges:  OT General Charges $OT Visit: 1 Visit OT Evaluation $OT Eval Moderate Complexity: 1 Mod OT Treatments $Self Care/Home Management : 8-22 mins  Barry Brunner, OT Acute Rehabilitation Services Pager [phone] Office [phone]    Chancy Milroy 10/08/2019, 10:11 AM

## 2019-10-09 ENCOUNTER — Inpatient Hospital Stay (HOSPITAL_COMMUNITY): Payer: Medicare HMO

## 2019-10-09 ENCOUNTER — Encounter (HOSPITAL_COMMUNITY): Payer: Medicare HMO

## 2019-10-09 DIAGNOSIS — Z8673 Personal history of transient ischemic attack (TIA), and cerebral infarction without residual deficits: Secondary | ICD-10-CM

## 2019-10-09 DIAGNOSIS — I4891 Unspecified atrial fibrillation: Secondary | ICD-10-CM

## 2019-10-09 DIAGNOSIS — R21 Rash and other nonspecific skin eruption: Secondary | ICD-10-CM

## 2019-10-09 DIAGNOSIS — R6510 Systemic inflammatory response syndrome (SIRS) of non-infectious origin without acute organ dysfunction: Secondary | ICD-10-CM

## 2019-10-09 LAB — CBC
HCT: 43 % (ref 36.0–46.0)
Hemoglobin: 14.5 g/dL (ref 12.0–15.0)
MCH: 30.3 pg (ref 26.0–34.0)
MCHC: 33.7 g/dL (ref 30.0–36.0)
MCV: 89.8 fL (ref 80.0–100.0)
Platelets: 333 10*3/uL (ref 150–400)
RBC: 4.79 MIL/uL (ref 3.87–5.11)
RDW: 14.4 % (ref 11.5–15.5)
WBC: 24.3 10*3/uL — ABNORMAL HIGH (ref 4.0–10.5)
nRBC: 0 % (ref 0.0–0.2)

## 2019-10-09 LAB — CBC WITH DIFFERENTIAL/PLATELET
Abs Immature Granulocytes: 0.26 10*3/uL — ABNORMAL HIGH (ref 0.00–0.07)
Basophils Absolute: 0 10*3/uL (ref 0.0–0.1)
Basophils Relative: 0 %
Eosinophils Absolute: 0.5 10*3/uL (ref 0.0–0.5)
Eosinophils Relative: 2 %
HCT: 42.5 % (ref 36.0–46.0)
Hemoglobin: 14.1 g/dL (ref 12.0–15.0)
Immature Granulocytes: 1 %
Lymphocytes Relative: 5 %
Lymphs Abs: 1.3 10*3/uL (ref 0.7–4.0)
MCH: 29.8 pg (ref 26.0–34.0)
MCHC: 33.2 g/dL (ref 30.0–36.0)
MCV: 89.9 fL (ref 80.0–100.0)
Monocytes Absolute: 0.7 10*3/uL (ref 0.1–1.0)
Monocytes Relative: 3 %
Neutro Abs: 24.7 10*3/uL — ABNORMAL HIGH (ref 1.7–7.7)
Neutrophils Relative %: 89 %
Platelets: 342 10*3/uL (ref 150–400)
RBC: 4.73 MIL/uL (ref 3.87–5.11)
RDW: 14.5 % (ref 11.5–15.5)
Smear Review: NORMAL
WBC: 27.5 10*3/uL — ABNORMAL HIGH (ref 4.0–10.5)
nRBC: 0 % (ref 0.0–0.2)

## 2019-10-09 LAB — BASIC METABOLIC PANEL
Anion gap: 12 (ref 5–15)
BUN: 12 mg/dL (ref 8–23)
CO2: 25 mmol/L (ref 22–32)
Calcium: 9.1 mg/dL (ref 8.9–10.3)
Chloride: 102 mmol/L (ref 98–111)
Creatinine, Ser: 0.93 mg/dL (ref 0.44–1.00)
GFR calc Af Amer: >60 mL/min (ref >60)
GFR calc non Af Amer: >60 mL/min (ref >60)
Glucose, Bld: 125 mg/dL — ABNORMAL HIGH (ref 70–99)
Potassium: 4.1 mmol/L (ref 3.5–5.1)
Sodium: 139 mmol/L (ref 135–145)

## 2019-10-09 LAB — LACTIC ACID, PLASMA
Lactic Acid, Venous: 2.9 mmol/L (ref 0.5–1.9)
Lactic Acid, Venous: 2.3 mmol/L (ref 0.5–1.9)

## 2019-10-09 LAB — TSH: TSH: 1.729 u[IU]/mL (ref 0.350–4.500)

## 2019-10-09 MED ORDER — HYDROXYZINE HCL 25 MG PO TABS: 25.0000 mg | ORAL_TABLET | Freq: Three times a day (TID) | ORAL | Status: DC | PRN

## 2019-10-09 MED ORDER — HYDROCORTISONE 1 % EX CREA
TOPICAL_CREAM | Freq: Two times a day (BID) | CUTANEOUS | Status: DC | PRN
  Filled 2019-10-09: qty 28

## 2019-10-09 MED ORDER — METOPROLOL TARTRATE 25 MG PO TABS
25.0000 mg | ORAL_TABLET | Freq: Four times a day (QID) | ORAL | Status: DC
  Administered 2019-10-09 – 2019-10-10 (×4): 25 mg via ORAL
  Filled 2019-10-09 (×5): qty 1

## 2019-10-09 MED ORDER — DILTIAZEM HCL-DEXTROSE 125-5 MG/125ML-% IV SOLN (PREMIX)
5.0000 mg/h | INTRAVENOUS | Status: DC
  Administered 2019-10-09: 5 mg/h via INTRAVENOUS
  Filled 2019-10-09: qty 125

## 2019-10-09 NOTE — Progress Notes (Signed)
On arrival to room pt anxious as her niece recently fell and was going to get an x-ray. Initially pt was unsure if she would participate with therapy, but agreeable to sit EOB and see how she feels. Once EOB pt agreeable to transfer to recliner chair where she performed sit<>stand 10x, 3 different ways for neuromuscular re-ed. Pt reporting her LEs are too fatigued to participate in gait training. Will continue to follow acutely for mobility progression.   Kallie Locks, [place] Pager [phone] Acute Rehab    10/09/19 1540  PT Visit Information  Last PT Received On 10/09/19  Assistance Needed +1  History of Present Illness Pt is a 74 y/o female with PMH of CVA, HTN presenting to ED with facial droop.  In ED initally resolved but noted L sided symptoms again in triage, which resolved again by CT. CT negative, old L cerebellar infarct. CTA reveals M2 MCA branch proximal occlusion. MRI reveals scattered patchy inarction in the R MCA, most notable along the deep insular cortex, right lateral temporal cortex, and right parietal deep and subcortical white matter.  Subjective Data  Patient Stated Goal to get home   Precautions  Precautions Fall  Precaution Comments red/inflamation/rash t/o body  Pain Assessment  Pain Assessment No/denies pain  Cognition  Arousal/Alertness Awake/alert  Behavior During Therapy [facility] for tasks assessed/performed  Overall Cognitive Status No family/caregiver present to determine baseline cognitive functioning  General Comments Pt anxious on arrival as her niece had just fallen and was going to urgent care.   Bed Mobility  Overal bed mobility Needs Assistance  Bed Mobility Supine to Sit  Supine to sit Supervision  General bed mobility comments use of bed rails. Increased time and effort  Transfers  Overall transfer level Needs assistance  Equipment used Rolling walker (2 wheeled)  Transfers Sit to/from Stand  Sit to Stand Min guard  General transfer comment min guard  for safety  Ambulation/Gait  General Gait Details pt declining secondary to fatigue and anxiety  Modified Rankin (Stroke Patients Only)  Pre-Morbid Rankin Score 3  Modified Rankin 3  Balance  Overall balance assessment Needs assistance  Sitting-balance support No upper extremity supported;Feet supported  Sitting balance-Leahy Scale Fair  Standing balance support Bilateral upper extremity supported;During functional activity  Standing balance-Leahy Scale Fair  Standing balance comment relies on BUE support dynamically  General Comments  General comments (skin integrity, edema, etc.) Assist required to don socks. Pt has adaptive device to use at home.  Exercises  Exercises Other exercises  Other Exercises  Other Exercises sit<>stand 10x 3 differnet ways; 4x normal; 3x w/ LLE to the side; 3x LLE forward, to promote increased WB on R LE. Min A required when LLE was placed at disadvantage.  PT - End of Session  Equipment Utilized During Treatment Gait belt  Activity Tolerance Patient tolerated treatment well  Patient left in chair;with call bell/phone within reach (with OT)  Nurse Communication Mobility status   PT - Assessment/Plan  PT Plan Current plan remains appropriate  PT Visit Diagnosis Unsteadiness on feet (R26.81);Difficulty in walking, not elsewhere classified (R26.2)  PT Frequency (ACUTE ONLY) Min 4X/week  Follow Up Recommendations Home health PT;Supervision/Assistance - 24 hour  PT equipment None recommended by PT  AM-PAC PT "6 Clicks" Mobility Outcome Measure (Version 2)  Help needed turning from your back to your side while in a flat bed without using bedrails? 4  Help needed moving from lying on your back to sitting on the side of  a flat bed without using bedrails? 3  Help needed moving to and from a bed to a chair (including a wheelchair)? 3  Help needed standing up from a chair using your arms (e.g., wheelchair or bedside chair)? 3  Help needed to walk in hospital  room? 3  Help needed climbing 3-5 steps with a railing?  2  6 Click Score 18  Consider Recommendation of Discharge To: Home with [facility]  PT Goal Progression  Progress towards PT goals Progressing toward goals  Acute Rehab PT Goals  PT Goal Formulation With patient  Time For Goal Achievement 10/22/19  Potential to Achieve Goals Good  PT Time Calculation  PT Start Time (ACUTE ONLY) 1504  PT Stop Time (ACUTE ONLY) 1537  PT Time Calculation (min) (ACUTE ONLY) 33 min  PT General Charges  $$ ACUTE PT VISIT 1 Visit  PT Treatments  $Neuromuscular Re-education 23-37 mins

## 2019-10-09 NOTE — Progress Notes (Signed)
Notified of pt's critical lactic acid lab value of 2.9. Notified Dr. Katrinka Blazing. Received verbal orders to recollected lactic acid STAT and if levels remain elevated to administer NS 0.9% 500 mL bolus. Will notify oncoming nurse.

## 2019-10-09 NOTE — Progress Notes (Signed)
Occupational Therapy Treatment Patient Details Name: Alyssa Small MRN: [id number] DOB: [birth year] Today's Date: 10/09/2019    History of present illness Pt is a 74 y/o female with PMH of CVA, HTN presenting to ED with facial droop.  In ED initally resolved but noted L sided symptoms again in triage, which resolved again by CT. CT negative, old L cerebellar infarct. CTA reveals M2 MCA branch proximal occlusion. MRI reveals scattered patchy inarction in the R MCA, most notable along the deep insular cortex, right lateral temporal cortex, and right parietal deep and subcortical white matter.   OT comments  Functional cognition further assessed with The Pillbox Test: A Measure of Executive Functioning and Estimate of Medication Management. A straight pass/fail designation is determined by 3 or more errors of omission or misplacement on the task. Patient completed test with 1 error, taking more than 5 minutes only.  Noted patient with poor organizational skills, attention and recall during task; niece present and reports STM deficits at baseline.  Discussed observations with patient and niece, niece plans to assist with medications as needed.   Patient completed part of test at EOB and returned to supine due to fatigue.  Will continue to benefit from [facility] services after dc to optimize independence and safety to return back to her private apartment (plans to stay upstairs initially to have 24/7 support).    Follow Up Recommendations  Home health OT;Supervision/Assistance - 24 hour    Equipment Recommendations  None recommended by OT    Recommendations for Other Services      Precautions / Restrictions Precautions Precautions: Fall Precaution Comments: red/inflamation/rash t/o body Restrictions Weight Bearing Restrictions: No       Mobility Bed Mobility Overal bed mobility: Needs Assistance Bed Mobility: Rolling;Sidelying to Sit Rolling: Supervision Sidelying to sit: Min guard Supine to  sit: Supervision Sit to supine: Supervision   General bed mobility comments: use of bed rail, min guard for safety, increased time  Transfers Overall transfer level: Needs assistance Equipment used: Rolling walker (2 wheeled) Transfers: Sit to/from Stand Sit to Stand: Min guard         General transfer comment: min guard for safety/balance, cueing for hand placement     Balance Overall balance assessment: Needs assistance Sitting-balance support: No upper extremity supported;Feet supported Sitting balance-Leahy Scale: Fair Sitting balance - Comments: limited tolerance at EOB    Standing balance support: Bilateral upper extremity supported;During functional activity Standing balance-Leahy Scale: Fair Standing balance comment: relies on BUE support dynamically                           ADL either performed or assessed with clinical judgement   ADL Overall ADL's : Needs assistance/impaired                                       General ADL Comments: session focused on functional cognition using pill box test     Vision       Perception     Praxis      Cognition Arousal/Alertness: Awake/alert Behavior During Therapy: WFL for tasks assessed/performed Overall Cognitive Status: No family/caregiver present to determine baseline cognitive functioning Area of Impairment: Problem solving;Awareness;Attention;Memory;Following commands                   Current Attention Level: Sustained Memory: Decreased short-term memory Following Commands: Follows one  step commands consistently;Follows one step commands with increased time   Awareness: Emergent Problem Solving: Slow processing;Requires verbal cues Functional cognition further assessed with The Pillbox Test: A Measure of Executive Functioning and Estimate of Medication Management. A straight pass/fail designation is determined by 3 or more errors of omission or misplacement on the task.  The pt completed the test with less than 3 errors.0 errors for placement, 1 error for time (should take 5 minutes, took 10 minutes to complete).          Exercises     Shoulder Instructions       General Comments      Pertinent Vitals/ Pain       Pain Assessment: No/denies pain Faces Pain Scale: Hurts little more Pain Location: L hip Pain Descriptors / Indicators: Sore  Home Living                                          Prior Functioning/Environment              Frequency  Min 2X/week        Progress Toward Goals  OT Goals(current goals can now be found in the care plan section)  Progress towards OT goals: Progressing toward goals  Acute Rehab OT Goals Patient Stated Goal: to get home  OT Goal Formulation: With patient  Plan Discharge plan remains appropriate;Frequency remains appropriate    Co-evaluation                 AM-PAC OT "6 Clicks" Daily Activity     Outcome Measure   Help from another person eating meals?: A Little Help from another person taking care of personal grooming?: A Little Help from another person toileting, which includes using toliet, bedpan, or urinal?: A Little Help from another person bathing (including washing, rinsing, drying)?: A Little Help from another person to put on and taking off regular upper body clothing?: A Little Help from another person to put on and taking off regular lower body clothing?: A Little 6 Click Score: 18    End of Session    OT Visit Diagnosis: Other abnormalities of gait and mobility (R26.89);Pain;Muscle weakness (generalized) (M62.81)   Activity Tolerance Patient tolerated treatment well   Patient Left in bed;with call bell/phone within reach;with bed alarm set;with family/visitor present;with SCD's reapplied   Nurse Communication Mobility status        Time: 5277-8242 OT Time Calculation (min): 31 min  Charges: OT General Charges $OT Visit: 1 Visit OT  Treatments $Self Care/Home Management : 8-22 mins $Cognitive Funtion inital: Initial 15 mins  Alyssa Small, OT Acute Rehabilitation Services Pager [phone] Office [phone]    Alyssa Small 10/09/2019, 3:42 PM

## 2019-10-09 NOTE — Progress Notes (Signed)
  Speech Language Pathology Treatment: Dysphagia;Cognitive-Linquistic  Patient Details Name: Alyssa Small MRN: [id number] DOB: 23-Mar-1945 Today's Date: 10/09/2019 Time: 4481-8563 SLP Time Calculation (min) (ACUTE ONLY): 20 min  Assessment / Plan / Recommendation Clinical Impression  Alyssa Small demonstrates greatly improved facial symmetry and strength. She had no notable L facial droop this date and was seen with thin liquids via cup without anterior spillage. She was also seen with regular solids without pocketing or anterior spillage. She is requesting a diet upgrade so she can order what she wants. This appears appropriate given improvement. Speech was fully intelligible. Alyssa Small/SLP/niece (caregiver) reviewed findings of her cognitive evaluation and discussed them. Alyssa Small did confirm that the test was quite hard for her. She feels that she would do better today. Portions were provided with Alyssa Small scoring 3/5 on delayed recall and 8/8 on paragraph memory. She completed medication management questions verbally with acc'y in 4/4, however, questions were basic. Niece present reports that she plans to take over medications at discharge and that she handles finances, cooking, driving for the patient. She does have ongoing mild memory deficits observed during session, both with structured tasks and noted in conversation. Niece reports that some memory deficits are baseline from prior CVA and that patient appears to be mostly back to normal now.  Recommend: upgrade to regular solids, cognitive treatment x1 more for memory and education.    HPI HPI: Alyssa Small is a 74 yo female presenting with fluctuating L facial droop and L inattention. CT negative, old L cerebellar infarct. CTA reveals M2 MCA branch proximal occlusion. MRI reveals scattered patchy inarction in the R MCA, most notable along the deep insular cortex, right lateral temporal cortex, and right parietal deep and subcortical white matter. PMH: CVA, HTN, GERD       SLP Plan  Continue with current plan of care       Recommendations  Supervision: Patient able to self feed                Oral Care Recommendations: Oral care BID Follow up Recommendations: Outpatient SLP;Home health SLP SLP Visit Diagnosis: Cognitive communication deficit (J49.702) Plan: Continue with current plan of care                   Muzamil Harker P. Aidric Endicott, M.S., CCC-SLP Speech-Language Pathologist Acute Rehabilitation Services Pager: [phone]   Susanne Borders Nohemi Nicklaus 10/09/2019, 2:49 PM

## 2019-10-09 NOTE — Progress Notes (Addendum)
STROKE TEAM PROGRESS NOTE   Subjective/Interval History: She is resting in bed alone with no family members at bedside. She still has a diffuse rash noted to her chest, bilateral arms and bilateral legs with tiny white papules noted to her face. At times it itches and at times it does not- it is not painful at all to her. She notes some relief with the benadryl. She states that the rash feels like when she had shingles.   Objective: Temp:  [98.3 F (36.8 C)-100.3 F (37.9 C)] 98.3 F (36.8 C) (08/11 0739) Pulse Rate:  [84-148] 143 (08/11 0922) Cardiac Rhythm: Atrial fibrillation (08/11 0922) Resp:  [16-24] 16 (08/11 1115) BP: (99-148)/(40-85) 99/77 (08/11 0922) SpO2:  [94 %-99 %] 96 % (08/11 0922)  Recent Labs  Lab 10/07/19 1628  GLUCAP 100*   Recent Labs  Lab 10/07/19 1301 10/07/19 1309 10/09/19 0441  NA 140 142 139  K 4.0 3.5 4.1  CL 104 103 102  CO2 25  --  25  GLUCOSE 107* 106* 125*  BUN 9 10 12   CREATININE 0.94 0.90 0.93  CALCIUM 9.4  --  9.1   Recent Labs  Lab 10/07/19 1301  AST 25  ALT 12  ALKPHOS 80  BILITOT 0.9  PROT 7.1  ALBUMIN 3.6   Recent Labs  Lab 10/07/19 1301 10/07/19 1309 10/09/19 0441  WBC 7.0  --  24.3*  NEUTROABS 4.2  --   --   HGB 13.6 14.3 14.5  HCT 41.2 42.0 43.0  MCV 90.9  --  89.8  PLT 340  --  333    Recent Labs    10/07/19 1301  LABPROT 12.9  INR 1.0   10/08/19 Lipid Panel    Component Value Date/Time   CHOL 171 10/08/2019 0433   TRIG 43 10/08/2019 0433   HDL 81 10/08/2019 0433   CHOLHDL 2.1 10/08/2019 0433   VLDL 9 10/08/2019 0433   LDLCALC 81 10/08/2019 0433   Lab Results  Component Value Date   HGBA1C 5.5 10/08/2019   Imaging Studies:  I have personally reviewed the radiological images below and agree with the radiology interpretations.  10/07/19 CT Head No acute intracranial findings. Old left cerebellar stroke.   10/07/19 CT Perfusion  Occlusion of a proximal right M2 branch with some reconstitution more  distally.Perfusion imaging demonstrates no evidence of core infarction. However, there is 33 mL of penumbra identified in the posterior right MCA territory. No measurable stenosis in the neck.1 mm outpouching of the distal supraclinoid left ICA likely reflecting an infundibulum. Aspects score is 10.   10/07/19 CT Angio Head and Neck  Neck findings: Great vessel origins are patent. Right carotid system is patient. Partially retropharyngeal course of the ICA. No measurable stenosis. Left carotid system is patent with no measurable stenosis.   Head findings: Anterior circulation: Intracranial internal carotid arteries are patent with mild calcified plaque. There is a 1 mm inferiorly directed outpouching of the distal supraclinoid left ICA. Appears to be a diminutive branch arising from this area. Right M1 MCA is patent. There is occlusion of a proximal right M2 MCA branch with some reconstitution more distally. Left middle and both anterior cerebral arteries are patent. An anterior communicating artery is present.  Posterior circulation: Intracranial vertebral arteries, basilar artery, and posterior cerebral arteries are patent.   10/07/19 MRI Brain WO Contrast Few small foci of scattered patchy infarction in the right MCA territory as noted above, most notable along the deep insular cortex,  right lateral temporal cortex, and right parietal deep and subcortical white matter. No large confluent acute infarction. No mass effect or evidence of parenchymal hemorrhage. One can appreciate signal loss associated with the occluded right MCA branch in the insula on the susceptibility weighted imaging.Old left cerebellar infarction. Chronic small-vessel ischemic changes of the cerebral hemispheric white matter.  10/07/19 CXR 2 View Normal sized heart. Clear lungs with normal vascularity. Thoracic spine degenerative changes. Diffuse osteopenia.  10/08/19 Echocardiogram Complete  Ejection Fraction 60-65 %, left  ventricle has normal function, right ventricular systolic function is normal, left atrium normal in size, right atrium normal in size, no evidence of pericardial effusion, no mitral tricuspid or aortic valvular regurgitation noted  10/09/19 CXR 2 View  Trachea is midline. Heart size within normal limits. Streaky atelectasis or scarring in the left lower lobe. Lungs are otherwise clear. No pleural fluid. Extensive osteophytosis in the thoracic spine.  Physical Examination: General: Well nourished, well developed, in no apparent distress. Mental status: AAOx4, follows commands  Speech: Fluent with repetition and naming intact, no dysarthria  Cranial nerves: EOMI, VFF, Face symmetric, shoulder shrug intact  Motor: Strength 5/5 throughout, no drift noted to any extremity  Coordination: No ataxia w/FNF or HTS  Sensation: Intact to light touch throughout  Gait: Deferred  NIHSS: 0   Assessment and Plan: [name] is a 74 y.o. female w/pmh of L PICA Stroke(10/29/14), HTN, HLD, UTI, Depression, Arthritis, Hypothyroidism who presents with a R MCA Stroke cardioembolic in the setting of newly diagnosed atrial fibrillation that was found this hospital admission. Her CTA Head and Neck upon admission was notable for a right M2 branch occlusion. She was not a candidate for IVTPA as she was outside of the time window to receive it. Her NIHSS was 1 on admission and given low NIHSS, after discussion with interventional radiology, the decision was made to defer mechanical thrombectomy.  NEURO #R MCA Stroke in the setting of right M2 occlusion, cardioembolic in the setting of newly found atrial fibrillation  Her stroke work is complete at this time. Her MRI revealed right MCA patchy small infarcts. Echocardiogram showed relatively normal heart function and was negative for intracardiac source of stroke. Stroke labs were done including lipid panel w/LDL 81, hemoglobin A1C 5.5 She was originally placed on DAPT  with the plan to transition to Eliquis however due to concern for infection/diffuse rash, Plavix was discontinued on 10/09/19 and will hold off on initiating Eliquis until further work up is done for infection + rash.  - Continue Aspirin 81 mg for secondary stroke prevention  - Continue Zocor 40 mg for secondary stroke prevention - Consider initiating Eliquis when infection/rash work up is done   CARDS #Hypertension #Atrial Fibrillation  At home she takes Metoprolol XL 25 mg for blood pressure control. Currently her blood pressure is running normotensive and she was started on Metoprolol 25 Q6 by cardiology for rate control. Will continue to trend blood pressure and her rate, will defer to Cardiology for metoprolol adjustments. - Metoprolol 25 mg Q6 - Cardiology following     #Hyperlipidemia She takes Zocor 20 mg at home for HLD, this was increased to 40 mg this admission for better cholesterol control. Her LDL is 81, goal LDL from a stroke prevention standpoint is < 70. She is to continue Zocor 40 mg at discharge.   RENAL Her chemistries are stable per BMP on 10/09/19. Continue to trend chemistries.  ENDO #Hypothyroidism   Continue with home Levothyroxine  75 mcg daily.   GI #Acute dysphagia  Passed swallow evaluation with speech on 10/08/19. They recommended a NDD3 diet with thin liquids given dysphagia   ID  #Leukocytosis  Her WBC count jumped from 7.0 ( noted on admission CBC 10/07/19) to 24.3 as of 10/09/19. She also fevered on 8/10 with temperature noted to be 100.3. Infectious work up was initiated and the hospitalist team was consulted on 10/09/19. It is unsure if an infectious process is contributing to the rash that she has.  - F/U CXR, UA, Blood cultures - Trend temperature  - Daily CBC   HEME  H/H/P are stable. SCD's for DVT Prophylaxis  - Daily CBC   SKIN #Diffuse Rash w/White Papules After being admitted, she developed a diffuse rash to her chest, bilateral arms and  bilateral legs. The rash is itchy at times. She was given Benadryl 50 mg PO X1 on 10/08/19 that offered her some relief. The cause of the rash is unknown. As a precaution, her Plavix was discontinued on 10/09/19 given that it was a newly started medication that she was not previously on. The cause of the rash is unknown at this time.   Other Stroke Risk Factors Her other stroke risk factors include advanced age.   Other Active Problems:  #Depression She has a history of depression and her home medications Celexa 40 mg and Trazadone 50 mg were resumed for this.  DIspo: Currently recommended for Home PT/OT/ST by therapies   Hospital day # 2  Stark Jock, NP [facility] Nurse Practitioner  Patient seen and discussed with attending physician Dr. Roda Shutters  10/09/2019 11:19 AM  ATTENDING NOTE: I reviewed above note and agree with the assessment and plan. Pt was seen and examined.   Patient awake alert, orientated, neuro stable, no focal deficit.  However, overnight patient continued to have red rashes throughout the face and trunk and bilateral proximal arms and legs.  Also had low-grade fever T-max 100.3.  Developed A. fib RVR, needed cardiology consultation and put on Cardizem drip but not tolerating due to low BP.  Cardizem drip discontinued.  Put on metoprolol.  This morning patient ready converted back to normal sinus rhythm.  Rate controlled.    Patient also found to have leukocytosis WBC 24.3, and repeat WBC 27.5.  Blood culture sent, UA pending.  C. difficile negative.  Cardiology Dr. Bufford Buttner is on board, recommend indomethacin consultation.  Had hospitalist Dr. Katrinka Blazing on board, checked LA 2.9, and discussed with [facility] dermatology on-call, agree with holding Plavix, discontinue chlorhexidine gluconate pads (which has been discontinued the prior and process).  We also consider skin punch biopsy.  We will consult surgery.  In terms of stroke etiology, given patient prior large  left cerebellar infarct and this time right M2 occlusion, A. fib RVR likely to be the culprit.  Given the small size of a stroke, agree with Eliquis for stroke prevention.  However, would not start Eliquis until rash improves.  Continue aspirin and Zocor at this time.    Marvel Plan, MD PhD Stroke Neurology 10/09/2019 6:47 PM   Patient condition worsened within the last 24 hours, has developed A. fib RVR, diffuse rash, leukocytosis, low-grade fever, continues to have right MCA small scattered infarcts, and I have ordered blood culture, UA, discontinue the Plavix and consulted internal medicine. I discussed with Dr. Cannon Kettle over the secure messaging system. I spent  35 minutes in total face-to-face time with the patient, more than 50% of  which was spent in counseling and coordination of care, reviewing test results, images and medication, and discussing the diagnosis, treatment plan and potential prognosis. This patient's care requiresreview of multiple databases, neurological assessment, discussion with family, other specialists and medical decision making of high complexity. I had long discussion with niece and patient at bedside, updated pt current condition, treatment plan and potential prognosis, and answered all the questions. They expressed understanding and appreciation.       To contact Stroke Continuity provider, please refer to [email or web address]. After hours, contact General Neurology

## 2019-10-09 NOTE — TOC Initial Note (Signed)
Transition of Care [facility]) - Initial/Assessment Note    Patient Details  Name: Jesus Poplin MRN: [id number] Date of Birth: 1945/11/29  Transition of Care [facility]) CM/SW Contact:    Kermit Balo, RN Phone Number: 10/09/2019, 12:34 PM  Clinical Narrative:                 Pt lives in an apartment downstairs from her sister and niece. The plan at d/c is for her to stay with her sister and niece so she will have supervision.  Pts sister is active with [facility] and they would like to use them for her [facility]. Britney with [facility] accepted the referral. Niece is requesting a HH RN for a few visits at the home.  Pt has all needed DME.  TOC following for further d/c needs.    Expected Discharge Plan: Home w Home Health Services Barriers to Discharge: Continued Medical Work up   Patient Goals and CMS Choice   CMS Medicare.gov Compare Post Acute Care list provided to:: Patient Choice offered to / list presented to : Patient (niece)  Expected Discharge Plan and Services Expected Discharge Plan: Home w Home Health Services   Discharge Planning Services: CM Consult Post Acute Care Choice: Home Health Living arrangements for the past 2 months: Apartment                           HH Arranged: PT, OT, Speech Therapy, RN HH Agency: [facility] Date [facility] Agency Contacted: 10/09/19   Representative spoke with at [facility] Agency: Teressa Lower  Prior Living Arrangements/Services Living arrangements for the past 2 months: Apartment Lives with:: Self Patient language and need for interpreter reviewed:: Yes Do you feel safe going back to the place where you live?: Yes      Need for Family Participation in Patient Care: Yes (Comment) Care giver support system in place?: Yes (comment) Current home services: DME (walker/ cane/ 3 in 1/ shower seat) Criminal Activity/Legal Involvement Pertinent to Current Situation/Hospitalization: No - Comment as needed  Activities of Daily Living Home  Assistive Devices/Equipment: None ADL Screening (condition at time of admission) Patient's cognitive ability adequate to safely complete daily activities?: Yes Is the patient deaf or have difficulty hearing?: No Does the patient have difficulty seeing, even when wearing glasses/contacts?: No Does the patient have difficulty concentrating, remembering, or making decisions?: No Patient able to express need for assistance with ADLs?: Yes Does the patient have difficulty dressing or bathing?: No Independently performs ADLs?: Yes (appropriate for developmental age) Does the patient have difficulty walking or climbing stairs?: No Weakness of Legs: Left Weakness of Arms/Hands: Left  Permission Sought/Granted                  Emotional Assessment Appearance:: Appears stated age Attitude/Demeanor/Rapport: Engaged Affect (typically observed): Accepting Orientation: : Oriented to Self, Oriented to Place, Oriented to  Time, Oriented to Situation   Psych Involvement: No (comment)  Admission diagnosis:  Stroke [facility]) [I63.9] Stroke (cerebrum) [facility]) [I63.9] Patient Active Problem List   Diagnosis Date Noted   Atrial fibrillation with RVR (HCC)    Stroke (cerebrum) (HCC) 10/07/2019   PCP:  Patient, No Pcp Per Pharmacy:  No Pharmacies Listed    Social Determinants of Health (SDOH) Interventions    Readmission Risk Interventions No flowsheet data found.

## 2019-10-09 NOTE — Progress Notes (Signed)
Cardiology Progress Note  Patient ID: Alyssa Small MRN: [id number] DOB: 09-May-1945 Date of Encounter: 10/09/2019  Primary Cardiologist: No primary care provider on file.  Subjective   Chief Complaint: Afib  HPI: Weakness/fatigue reported. No palpitations. Tele shows Afib with RVR.   ROS:  All other ROS reviewed and negative. Pertinent positives noted in the HPI.     Inpatient Medications  Scheduled Meds: .  stroke: mapping our early stages of recovery book   Does not apply Once  . aspirin EC  325 mg Oral Daily  . Chlorhexidine Gluconate Cloth  6 each Topical Daily  . citalopram  20 mg Oral Daily  . clopidogrel  75 mg Oral Daily  . levothyroxine  75 mcg Oral q morning - 10a  . metoprolol tartrate  25 mg Oral Q6H  . pantoprazole  40 mg Oral Daily  . simvastatin  40 mg Oral QHS  . traZODone  50 mg Oral QHS   Continuous Infusions:  PRN Meds: acetaminophen **OR** acetaminophen (TYLENOL) oral liquid 160 mg/5 mL **OR** acetaminophen, albuterol, fluticasone, senna-docusate   Vital Signs   Vitals:   10/09/19 0300 10/09/19 0600 10/09/19 0629 10/09/19 0739  BP: 105/70 (!) 127/51 (!) 128/59 117/85  Pulse: (!) 128   (!) 131  Resp: (!) 24 (!) 24 (!) 21 19  Temp: 98.5 F (36.9 C) 98.7 F (37.1 C)  98.3 F (36.8 C)  TempSrc: Oral Oral  Oral  SpO2: 97% 97% 97% 99%    Intake/Output Summary (Last 24 hours) at 10/09/2019 0857 Last data filed at 10/08/2019 1000 Gross per 24 hour  Intake 45.69 ml  Output --  Net 45.69 ml   No flowsheet data found.    Telemetry  Overnight telemetry shows Afib with RVR 140-150 bpm, which I personally reviewed.   ECG  The most recent ECG shows Afib with RVR 145 bpm, which I personally reviewed.   Physical Exam   Vitals:   10/09/19 0300 10/09/19 0600 10/09/19 0629 10/09/19 0739  BP: 105/70 (!) 127/51 (!) 128/59 117/85  Pulse: (!) 128   (!) 131  Resp: (!) 24 (!) 24 (!) 21 19  Temp: 98.5 F (36.9 C) 98.7 F (37.1 C)  98.3 F (36.8 C)   TempSrc: Oral Oral  Oral  SpO2: 97% 97% 97% 99%     Intake/Output Summary (Last 24 hours) at 10/09/2019 0857 Last data filed at 10/08/2019 1000 Gross per 24 hour  Intake 45.69 ml  Output --  Net 45.69 ml   No flowsheet data found.  There is no height or weight on file to calculate BMI.   General: diffuse erythematous rash covering face, arms, back, buttocks Head: Atraumatic, normal size  Eyes: PEERLA, EOMI  Neck: Supple, no JVD Endocrine: No thryomegaly Cardiac: Normal S1, S2; irregular rhythm, no m/r/g Lungs: Clear to auscultation bilaterally, no wheezing, rhonchi or rales  Abd: Soft, nontender, no hepatomegaly  Ext: No edema, pulses 2+ Musculoskeletal: No deformities, BUE and BLE strength normal and equal Skin: diffuse erythematous rash covering face, arms, back, buttocks Neuro: Alert and oriented to person, place, time, and situation, CNII-XII grossly intact, no focal deficits  Psych: Normal mood and affect   Labs  High Sensitivity Troponin:  No results for input(s): TROPONINIHS in the last 720 hours.   Cardiac EnzymesNo results for input(s): TROPONINI in the last 168 hours. No results for input(s): TROPIPOC in the last 168 hours.  Chemistry Recent Labs  Lab 10/07/19 1301 10/07/19 1309 10/09/19 0441  NA  140 142 139  K 4.0 3.5 4.1  CL 104 103 102  CO2 25  --  25  GLUCOSE 107* 106* 125*  BUN CREATININE 0.94 0.90 0.93  CALCIUM 9.4  --  9.1  PROT 7.1  --   --   ALBUMIN 3.6  --   --   AST 25  --   --   ALT 12  --   --   ALKPHOS 80  --   --   BILITOT 0.9  --   --   GFRNONAA 59*  --  >60  GFRAA >60  --  >60  ANIONGAP 11  --  12    Hematology Recent Labs  Lab 10/07/19 1301 10/07/19 1309 10/09/19 0441  WBC 7.0  --  24.3*  RBC 4.53  --  4.79  HGB 13.6 14.3 14.5  HCT 41.2 42.0 43.0  MCV 90.9  --  89.8  MCH 30.0  --  30.3  MCHC 33.0  --  33.7  RDW 14.6  --  14.4  PLT 340  --  333   BNPNo results for input(s): BNP, PROBNP in the last 168 hours.   DDimer No results for input(s): DDIMER in the last 168 hours.   Radiology  DG Chest 2 View  Result Date: 10/07/2019 CLINICAL DATA:  Stroke. EXAM: CHEST - 2 VIEW COMPARISON:  11/02/2014 FINDINGS: Normal sized heart. Clear lungs with normal vascularity. Thoracic spine degenerative changes. Diffuse osteopenia. IMPRESSION: No acute abnormality. Electronically Signed   By: Beckie Salts M.D.   On: 10/07/2019 16:16   CT HEAD WO CONTRAST  Result Date: 10/07/2019 CLINICAL DATA:  Left-sided weakness.  Transient ischemic attack. EXAM: CT HEAD WITHOUT CONTRAST TECHNIQUE: Contiguous axial images were obtained from the base of the skull through the vertex without intravenous contrast. COMPARISON:  CT head 11/01/2014. FINDINGS: Brain: There is no evidence of acute intracranial hemorrhage, mass lesion, brain edema or extra-axial fluid collection. Old inferior left cerebellar infarct again noted with associated encephalomalacia. The ventricles and subarachnoid spaces are otherwise appropriately size for age. There is mild asymmetric gliosis in the right occipital white matter. There is no CT evidence of acute cortical infarction. Vascular: Intracranial vascular calcifications. No hyperdense vessel identified. Skull: Negative for fracture or focal lesion. Sinuses/Orbits: The visualized paranasal sinuses and mastoid air cells are clear. No orbital abnormalities are seen. Other: None. IMPRESSION: 1. No acute intracranial findings. 2. Old left cerebellar infarct. Electronically Signed   By: Carey Bullocks M.D.   On: 10/07/2019 13:33   MR BRAIN WO CONTRAST  Result Date: 10/07/2019 CLINICAL DATA:  Left facial droop. Left-sided weakness. Right M2 branch vessel occlusion by CT. EXAM: MRI HEAD WITHOUT CONTRAST TECHNIQUE: Multiplanar, multiecho pulse sequences of the brain and surrounding structures were obtained without intravenous contrast. COMPARISON:  CT studies earlier same day.  MRI 10/30/2014. FINDINGS: Brain: Diffusion  imaging shows a few scattered small patchy areas of infarction in the right MCA territory including in the insula, lateral temporal lobe, and parietal deep and subcortical white matter. No large confluent infarction. Old infarction affects the left cerebellum. Cerebral hemispheres otherwise show moderate chronic small-vessel ischemic changes of the white matter. One can appreciate signal loss in the right MCA branch responsible for the infarction on the susceptibility weighted imaging. No hydrocephalus or extra-axial collection. Vascular: Otherwise the major vessels at the base of the brain show flow. Skull and upper cervical spine: Negative Sinuses/Orbits: Clear/normal Other: None IMPRESSION: Few small foci  of scattered patchy infarction in the right MCA territory as noted above, most notable along the deep insular cortex, right lateral temporal cortex, and right parietal deep and subcortical white matter. No large confluent acute infarction. No mass effect or evidence of parenchymal hemorrhage. One can appreciate signal loss associated with the occluded right MCA branch in the insula on the susceptibility weighted imaging. Old left cerebellar infarction. Chronic small-vessel ischemic changes of the cerebral hemispheric white matter. Electronically Signed   By: Paulina Fusi M.D.   On: 10/07/2019 15:59   CT CEREBRAL PERFUSION W CONTRAST  Result Date: 10/07/2019 CLINICAL DATA:  Left-sided weakness EXAM: CT ANGIOGRAPHY HEAD AND NECK CT PERFUSION BRAIN TECHNIQUE: Multidetector CT imaging of the head and neck was performed using the standard protocol during bolus administration of intravenous contrast. Multiplanar CT image reconstructions and MIPs were obtained to evaluate the vascular anatomy. Carotid stenosis measurements (when applicable) are obtained utilizing NASCET criteria, using the distal internal carotid diameter as the denominator. Multiphase CT imaging of the brain was performed following IV bolus  contrast injection. Subsequent parametric perfusion maps were calculated using RAPID software. CONTRAST:  80mL OMNIPAQUE IOHEXOL 350 MG/ML SOLN COMPARISON:  None. FINDINGS: CTA NECK FINDINGS Aortic arch: Great vessel origins are patent. Right carotid system: Patent. Partially retropharyngeal course of the ICA. No measurable stenosis. Left carotid system: Patent. Partially retropharyngeal course of the ICA. No measurable stenosis. Vertebral arteries: Patent and codominant. Skeleton: Cervical spine degenerative changes. Other neck: No mass or adenopathy. Upper chest: Included upper lungs are clear. Review of the MIP images confirms the above findings CTA HEAD FINDINGS Anterior circulation: Intracranial internal carotid arteries are patent with mild calcified plaque. There is a 1 mm inferiorly directed outpouching of the distal supraclinoid left ICA. Appears to be a diminutive branch arising from this area. Right M1 MCA is patent. There is occlusion of a proximal right M2 MCA branch with some reconstitution more distally. Left middle and both anterior cerebral arteries are patent. An anterior communicating artery is present. Posterior circulation: Intracranial vertebral arteries, basilar artery, and posterior cerebral arteries are patent Venous sinuses: As permitted by contrast timing, patent. Review of the MIP images confirms the above findings CT Brain Perfusion Findings: ASPECTS: 10 CBF (<30%) Volume: 0mL Perfusion (Tmax>6.0s) volume: 33mL Mismatch Volume: 33mL Infarction Location:Posterior right MCA territory. IMPRESSION: There is occlusion of a proximal right M2 MCA branch with some reconstitution more distally. Perfusion imaging demonstrates no evidence of core infarction. However, there is 33 mL of penumbra identified in the posterior right MCA territory. No measurable stenosis in the neck. 1 mm outpouching of the distal supraclinoid left ICA likely reflecting an infundibulum. These results were called by  telephone at the time of interpretation on 10/07/2019 at 1:51 pm to provider Dr. Iver Nestle, who verbally acknowledged these results. Electronically Signed   By: Guadlupe Spanish M.D.   On: 10/07/2019 14:01   ECHOCARDIOGRAM COMPLETE  Result Date: 10/08/2019    ECHOCARDIOGRAM REPORT   Patient Name:   CHRISLYN SEEDORF Date of Exam: 10/08/2019 Medical Rec #:  [id number]    Height: Accession #:    [id number]   Weight: Date of Birth:  Jan 27, 1946    BSA: Patient Age:    74 years     BP:           129/58 mmHg Patient Gender: F            HR:           80 bpm. Exam  Location:  Inpatient Procedure: 2D Echo Indications:   stroke 434.91  History:       Patient has no prior history of Echocardiogram examinations.                Stroke.  Sonographer:   Delcie Roch Referring      [phone] DAVID R SMITH Phys: IMPRESSIONS  1. Left ventricular ejection fraction, by estimation, is 60 to 65%. The left ventricle has normal function. The left ventricle has no regional wall motion abnormalities. Left ventricular diastolic parameters are consistent with Grade II diastolic dysfunction (pseudonormalization). Elevated left atrial pressure.  2. Right ventricular systolic function is normal. The right ventricular size is normal.  3. The mitral valve is normal in structure. No evidence of mitral valve regurgitation. No evidence of mitral stenosis.  4. The aortic valve is normal in structure. Aortic valve regurgitation is not visualized. No aortic stenosis is present.  5. The inferior vena cava is normal in size with greater than 50% respiratory variability, suggesting right atrial pressure of 3 mmHg. FINDINGS  Left Ventricle: Left ventricular ejection fraction, by estimation, is 60 to 65%. The left ventricle has normal function. The left ventricle has no regional wall motion abnormalities. The left ventricular internal cavity size was normal in size. There is  no left ventricular hypertrophy. Left ventricular diastolic parameters are consistent with  Grade II diastolic dysfunction (pseudonormalization). Elevated left atrial pressure. Right Ventricle: The right ventricular size is normal. No increase in right ventricular wall thickness. Right ventricular systolic function is normal. Left Atrium: Left atrial size was normal in size. Right Atrium: Right atrial size was normal in size. Pericardium: There is no evidence of pericardial effusion. Mitral Valve: The mitral valve is normal in structure. Normal mobility of the mitral valve leaflets. No evidence of mitral valve regurgitation. No evidence of mitral valve stenosis. Tricuspid Valve: The tricuspid valve is normal in structure. Tricuspid valve regurgitation is not demonstrated. No evidence of tricuspid stenosis. Aortic Valve: The aortic valve is normal in structure. Aortic valve regurgitation is not visualized. No aortic stenosis is present. Pulmonic Valve: The pulmonic valve was normal in structure. Pulmonic valve regurgitation is not visualized. No evidence of pulmonic stenosis. Aorta: The aortic root is normal in size and structure. Venous: The inferior vena cava is normal in size with greater than 50% respiratory variability, suggesting right atrial pressure of 3 mmHg. IAS/Shunts: No atrial level shunt detected by color flow Doppler.  LEFT VENTRICLE PLAX 2D LVIDd:         3.90 cm  Diastology LVIDs:         2.80 cm  LV e' lateral:   10.40 cm/s LV PW:         0.90 cm  LV E/e' lateral: 11.3 LV IVS:        0.70 cm  LV e' medial:    7.40 cm/s LVOT diam:     1.80 cm  LV E/e' medial:  15.9 LV SV:         64 LVOT Area:     2.54 cm  RIGHT VENTRICLE             IVC RV S prime:     17.10 cm/s  IVC diam: 1.20 cm TAPSE (M-mode): 2.5 cm LEFT ATRIUM             RIGHT ATRIUM LA diam:        3.30 cm RA Area:     10.30 cm LA Vol (A2C):  38.4 ml RA Volume:   21.30 ml LA Vol (A4C):   44.2 ml LA Biplane Vol: 42.5 ml  AORTIC VALVE LVOT Vmax:   132.00 cm/s LVOT Vmean:  79.200 cm/s LVOT VTI:    0.250 m  AORTA Ao Root diam: 3.10  cm Ao Asc diam:  3.00 cm MITRAL VALVE MV Area (PHT): 3.65 cm     SHUNTS MV Decel Time: 208 msec     Systemic VTI:  0.25 m MV E velocity: 118.00 cm/s  Systemic Diam: 1.80 cm MV A velocity: 88.60 cm/s MV E/A ratio:  1.33 Mihai Croitoru MD Electronically signed by Thurmon Fair MD Signature Date/Time: 10/08/2019/11:42:35 AM    Final    CT ANGIO HEAD CODE STROKE  Result Date: 10/07/2019 CLINICAL DATA:  Left-sided weakness EXAM: CT ANGIOGRAPHY HEAD AND NECK CT PERFUSION BRAIN TECHNIQUE: Multidetector CT imaging of the head and neck was performed using the standard protocol during bolus administration of intravenous contrast. Multiplanar CT image reconstructions and MIPs were obtained to evaluate the vascular anatomy. Carotid stenosis measurements (when applicable) are obtained utilizing NASCET criteria, using the distal internal carotid diameter as the denominator. Multiphase CT imaging of the brain was performed following IV bolus contrast injection. Subsequent parametric perfusion maps were calculated using RAPID software. CONTRAST:  34mL OMNIPAQUE IOHEXOL 350 MG/ML SOLN COMPARISON:  None. FINDINGS: CTA NECK FINDINGS Aortic arch: Great vessel origins are patent. Right carotid system: Patent. Partially retropharyngeal course of the ICA. No measurable stenosis. Left carotid system: Patent. Partially retropharyngeal course of the ICA. No measurable stenosis. Vertebral arteries: Patent and codominant. Skeleton: Cervical spine degenerative changes. Other neck: No mass or adenopathy. Upper chest: Included upper lungs are clear. Review of the MIP images confirms the above findings CTA HEAD FINDINGS Anterior circulation: Intracranial internal carotid arteries are patent with mild calcified plaque. There is a 1 mm inferiorly directed outpouching of the distal supraclinoid left ICA. Appears to be a diminutive branch arising from this area. Right M1 MCA is patent. There is occlusion of a proximal right M2 MCA branch with some  reconstitution more distally. Left middle and both anterior cerebral arteries are patent. An anterior communicating artery is present. Posterior circulation: Intracranial vertebral arteries, basilar artery, and posterior cerebral arteries are patent Venous sinuses: As permitted by contrast timing, patent. Review of the MIP images confirms the above findings CT Brain Perfusion Findings: ASPECTS: 10 CBF (<30%) Volume: 82mL Perfusion (Tmax>6.0s) volume: 34mL Mismatch Volume: 49mL Infarction Location:Posterior right MCA territory. IMPRESSION: There is occlusion of a proximal right M2 MCA branch with some reconstitution more distally. Perfusion imaging demonstrates no evidence of core infarction. However, there is 33 mL of penumbra identified in the posterior right MCA territory. No measurable stenosis in the neck. 1 mm outpouching of the distal supraclinoid left ICA likely reflecting an infundibulum. These results were called by telephone at the time of interpretation on 10/07/2019 at 1:51 pm to provider Dr. Iver Nestle, who verbally acknowledged these results. Electronically Signed   By: Guadlupe Spanish M.D.   On: 10/07/2019 14:01   CT ANGIO NECK CODE STROKE  Result Date: 10/07/2019 CLINICAL DATA:  Left-sided weakness EXAM: CT ANGIOGRAPHY HEAD AND NECK CT PERFUSION BRAIN TECHNIQUE: Multidetector CT imaging of the head and neck was performed using the standard protocol during bolus administration of intravenous contrast. Multiplanar CT image reconstructions and MIPs were obtained to evaluate the vascular anatomy. Carotid stenosis measurements (when applicable) are obtained utilizing NASCET criteria, using the distal internal carotid diameter as the denominator.  Multiphase CT imaging of the brain was performed following IV bolus contrast injection. Subsequent parametric perfusion maps were calculated using RAPID software. CONTRAST:  80mL OMNIPAQUE IOHEXOL 350 MG/ML SOLN COMPARISON:  None. FINDINGS: CTA NECK FINDINGS Aortic  arch: Great vessel origins are patent. Right carotid system: Patent. Partially retropharyngeal course of the ICA. No measurable stenosis. Left carotid system: Patent. Partially retropharyngeal course of the ICA. No measurable stenosis. Vertebral arteries: Patent and codominant. Skeleton: Cervical spine degenerative changes. Other neck: No mass or adenopathy. Upper chest: Included upper lungs are clear. Review of the MIP images confirms the above findings CTA HEAD FINDINGS Anterior circulation: Intracranial internal carotid arteries are patent with mild calcified plaque. There is a 1 mm inferiorly directed outpouching of the distal supraclinoid left ICA. Appears to be a diminutive branch arising from this area. Right M1 MCA is patent. There is occlusion of a proximal right M2 MCA branch with some reconstitution more distally. Left middle and both anterior cerebral arteries are patent. An anterior communicating artery is present. Posterior circulation: Intracranial vertebral arteries, basilar artery, and posterior cerebral arteries are patent Venous sinuses: As permitted by contrast timing, patent. Review of the MIP images confirms the above findings CT Brain Perfusion Findings: ASPECTS: 10 CBF (<30%) Volume: 0mL Perfusion (Tmax>6.0s) volume: 33mL Mismatch Volume: 33mL Infarction Location:Posterior right MCA territory. IMPRESSION: There is occlusion of a proximal right M2 MCA branch with some reconstitution more distally. Perfusion imaging demonstrates no evidence of core infarction. However, there is 33 mL of penumbra identified in the posterior right MCA territory. No measurable stenosis in the neck. 1 mm outpouching of the distal supraclinoid left ICA likely reflecting an infundibulum. These results were called by telephone at the time of interpretation on 10/07/2019 at 1:51 pm to provider Dr. Iver NestleBhagat, who verbally acknowledged these results. Electronically Signed   By: Guadlupe SpanishPraneil  Patel M.D.   On: 10/07/2019 14:01     Cardiac Studies   TTE 10/08/2019 1. Left ventricular ejection fraction, by estimation, is 60 to 65%. The  left ventricle has normal function. The left ventricle has no regional  wall motion abnormalities. Left ventricular diastolic parameters are  consistent with Grade II diastolic  dysfunction (pseudonormalization). Elevated left atrial pressure.  2. Right ventricular systolic function is normal. The right ventricular  size is normal.  3. The mitral valve is normal in structure. No evidence of mitral valve  regurgitation. No evidence of mitral stenosis.  4. The aortic valve is normal in structure. Aortic valve regurgitation is  not visualized. No aortic stenosis is present.  5. The inferior vena cava is normal in size with greater than 50%  respiratory variability, suggesting right atrial pressure of 3 mmHg.   Patient Profile  Alyssa Small is a 74 y.o. female with history of prior CVA admitted with CVA. Cardiology consulted 8/10 due to Afib with RVR.   Assessment & Plan   1. New-onset Afib with RVR -her diagnosis of Afib has coincided with new systemic rash and profound leukocytosis. She reports she did not feel well prior to Afib starting. Overall, I am concerned something systemic is driving her Afib right now. I discussed with neuro and they will pursue infectious work-up. -TTE with normal EF -TSH normal -blood cultures ordered by primary team. I ordered a repeat CXR -no recent medications changes to explain rash. No steroids to explain WBC elevation -start eliquis when able per neuro recommendations -I recommend rate control for now and work-up for infection. I increased metoprolol tartrate to  25 mg Q6 hours. Bps soft, and if run into trouble, will need amiodarone drip.   For questions or updates, please contact [facility] Please consult [email or web address] for contact info under   Time Spent with Patient: I have spent a total of 25 minutes with patient reviewing  hospital notes, telemetry, EKGs, labs and examining the patient as well as establishing an assessment and plan that was discussed with the patient.  > 50% of time was spent in direct patient care.    Signed, Lenna Gilford. Flora Lipps, MD [facility]  10/09/2019 8:57 AM

## 2019-10-09 NOTE — Consult Note (Signed)
[facility] Medical Consultation  Alyssa Small IOM:[id number] DOB: Oct 14, 1945 DOA: 10/07/2019 PCP: Patient, No Pcp Per   Requesting physician: Dr. Roda Shutters Date of consultation: 10/09/2019 Reason for consultation: Low-grade fever, leukocytosis, rash  Impression/Recommendations Active Problems:   Stroke (cerebrum) (HCC)   Atrial fibrillation with RVR (HCC)     1. Acute cardioembolic right MCA stroke with history of CVA: Patient was found with a right MCA stroke thought to be cardioembolic in nature given patient in A. fib with RVR or.  Currently placed on full dose aspirin and statin. -Per neurology  2. Atrial fibrillation with RVR: CHA2DS2-VASc = 6.  Eliquis currently on hold due to concern for possible underlying infection.  Patient was placed on metoprolol 25 mg p.o. every 6 hours. -Per cardiology  3. SIRS: Patient was noted to have a temperature up to 100.3 F with tachycardia and tachypnea.  WBC was elevated up to 24.3.  Chest x-ray did not note any acute signs of infection. -Follow-up blood culture -Follow-up urinalysis -Add on lactic acid  4. Diffuse rash: Acute.  Patient was noted to have a diffuse maculopapular rash low-grade fever 100.3 F.  Patient with note of small ulceration on the left side of the lower lip.  Unclear what possibly onset symptoms at this time.  Question if linked to contrast for imaging studies of the brain versus chlorhexidine gluconate wash pads versus Plavix.  Discussed case with dermatology on-call at [facility] who question possibility of a inflammatory reaction, but did not think necessarily Stevens-Johnson's syndrome or drug reaction at this time unless patient had more mucocutaneous involvement -Agree with holding Plavix for now  -Discontinued chlorhexidine gluconate pads -May warrant punch biopsy for histopathologic examination  -If patient has worsening progression of the rash with increased mucocutaneous involvement would recommend  transfer to [facility]  -Atarax/hydrocortisone cream as needed for itching    I will followup again tomorrow. Please contact me if I can be of assistance in the meanwhile. Thank you for this consultation.  Chief Complaint: Left-sided weakness  HPI:  Alyssa Small is a 74 y.o. female with medical history significant of hypertension, CVA, arthritis and GERD after being found to have left-sided facial droop and weakness on 8/9.  Found to have an new acute right MCA stroke.  As patient was found in atrial fibrillation with RVR with heart rates into the 150s cause was thought to be likely cardio embolic in nature.  On hospital day 2 patient was noted to breakout in a rash going from her head down to her legs.  Patient had been given contrast media for cerebral CT,  IV fluids of normal saline at 75 mL/h overnight, and had received chlorhexidine gluconate pads for washing.  She was given Benadryl for her symptoms without improvement.  The only new medication that appears to have been started was Plavix yesterday morning, but possibly was given after symptoms had already started.  Associated symptoms of low-grade fever up to 100.3 F yesterday.    Review of Systems  Constitutional: Positive for fever.  HENT: Negative for ear discharge.   Eyes: Negative for photophobia and pain.  Respiratory: Negative for cough and shortness of breath.   Cardiovascular: Negative for chest pain.  Gastrointestinal: Negative for abdominal pain and vomiting.  Genitourinary: Negative for dysuria and hematuria.  Musculoskeletal: Negative for falls.  Skin: Positive for rash.  Neurological: Positive for speech change and focal weakness. Negative for loss of consciousness.  Psychiatric/Behavioral: Negative for substance abuse.  Past Medical History:  Diagnosis Date  . Arthritis   . GERD (gastroesophageal reflux disease)   . Hypertension   . Stroke [facility])    2007 no deficits    History reviewed. No  pertinent surgical history. Social History:  reports that she has never smoked. She has never used smokeless tobacco. She reports that she does not drink alcohol and does not use drugs.  Allergies  Allergen Reactions  . Atorvastatin Other (See Comments)    Muscle spasom Muscle spasom   . Zolpidem Other (See Comments)    confusion confusion   . Ceftriaxone Rash    Other reaction(s): Rash Redness  Redness    Family History  Problem Relation Age of Onset  . Hypertension Mother   . Hypertension Father     Prior to Admission medications   Medication Sig Start Date End Date Taking? Authorizing Provider  albuterol (VENTOLIN HFA) 108 (90 Base) MCG/ACT inhaler Inhale 2 puffs into the lungs every 4 (four) hours as needed for shortness of breath. 09/24/19  Yes [provider]  aspirin EC 81 MG tablet Take 81 mg by mouth daily. Swallow whole.   Yes [provider]  citalopram (CELEXA) 40 MG tablet Take 40 mg by mouth daily. 09/10/19  Yes [provider]  fluticasone (FLONASE) 50 MCG/ACT nasal spray Place 1 spray into both nostrils daily as needed for allergies. 01/07/15  Yes [provider]  levothyroxine (SYNTHROID) 75 MCG tablet Take 75 mcg by mouth every morning. 07/12/19  Yes [provider]  metoprolol succinate (TOPROL-XL) 25 MG 24 hr tablet Take 25 mg by mouth daily. 08/17/19  Yes [provider]  omeprazole (PRILOSEC) 20 MG capsule Take 20 mg by mouth daily. 08/30/19  Yes [provider]  simvastatin (ZOCOR) 20 MG tablet Take 20 mg by mouth at bedtime. 07/12/19  Yes [provider]  traZODone (DESYREL) 50 MG tablet Take 50 mg by mouth at bedtime. 08/29/19  Yes [provider]  Vitamin D, Ergocalciferol, (DRISDOL) 1.25 MG (50000 UNIT) CAPS capsule Take 50,000 Units by mouth every 14 (fourteen) days. 08/24/19  Yes [provider]   Physical Exam:  Constitutional: Elderly female currently in no acute  distress Vitals:   10/09/19 0739 10/09/19 0922 10/09/19 1115 10/09/19 1130  BP: 117/85 99/77  (!) 89/57  Pulse: (!) 131 (!) 143  97  Resp: 19 18 16 20   Temp: 98.3 F (36.8 C)     TempSrc: Oral Oral  Oral  SpO2: 99% 96%  97%   Eyes: PERRL, lids and conjunctivae normal ENMT: Mucous membranes are moist. Posterior pharynx clear of any exudate or lesions. Small ulceration noted on left side of the lower lip. Neck: normal, supple, no masses, no thyromegaly Respiratory: clear to auscultation bilaterally, no wheezing, no crackles. Normal respiratory effort. No accessory muscle use.  Cardiovascular: Regular rate and rhythm, no murmurs / rubs / gallops. No extremity edema. 2+ pedal pulses. No carotid bruits.  Abdomen: no tenderness, no masses palpated. No hepatosplenomegaly. Bowel sounds positive.  Musculoskeletal: no clubbing / cyanosis. No joint deformity upper and lower extremities. Good ROM, no contractures. Normal muscle tone.  Skin: Diffuse maculopapular rash of the upper and lower extremities with signs of ulceration of the left side of the lower lip. Neurologic: CN 2-12 grossly intact. Sensation intact, DTR normal. Strength 5/5 in all 4.  Psychiatric: Normal judgment and insight. Alert and oriented x 3. Normal mood.   Labs on Admission:  Basic Metabolic Panel:  Recent Labs  Lab 10/07/19 1301 10/07/19 1309 10/09/19 0441  NA 140 142 139  K 4.0 3.5 4.1  CL 104 103 102  CO2 25  --  25  GLUCOSE 107* 106* 125*  BUN 9 10 12   CREATININE 0.94 0.90 0.93  CALCIUM 9.4  --  9.1   Liver Function Tests: Recent Labs  Lab 10/07/19 1301  AST 25  ALT 12  ALKPHOS 80  BILITOT 0.9  PROT 7.1  ALBUMIN 3.6   No results for input(s): LIPASE, AMYLASE in the last 168 hours. No results for input(s): AMMONIA in the last 168 hours. CBC: Recent Labs  Lab 10/07/19 1301 10/07/19 1309 10/09/19 0441  WBC 7.0  --  24.3*  NEUTROABS 4.2  --   --   HGB 13.6 14.3 14.5  HCT 41.2 42.0 43.0  MCV 90.9   --  89.8  PLT 340  --  333   Cardiac Enzymes: No results for input(s): CKTOTAL, CKMB, CKMBINDEX, TROPONINI in the last 168 hours. BNP: Invalid input(s): POCBNP CBG: Recent Labs  Lab 10/07/19 1628  GLUCAP 100*    Radiological Exams on Admission: DG Chest 2 View  Result Date: 10/09/2019 CLINICAL DATA:  Stroke, rash. EXAM: CHEST - 2 VIEW COMPARISON:  10/07/2019. FINDINGS: Trachea is midline. Heart size within normal limits. Streaky atelectasis or scarring in the left lower lobe. Lungs are otherwise clear. No pleural fluid. Extensive osteophytosis in the thoracic spine. IMPRESSION: Streaky atelectasis or scarring in the left lower lobe. No acute findings. Electronically Signed   By: 12/07/2019 M.D.   On: 10/09/2019 10:07   DG Chest 2 View  Result Date: 10/07/2019 CLINICAL DATA:  Stroke. EXAM: CHEST - 2 VIEW COMPARISON:  11/02/2014 FINDINGS: Normal sized heart. Clear lungs with normal vascularity. Thoracic spine degenerative changes. Diffuse osteopenia. IMPRESSION: No acute abnormality. Electronically Signed   By: 01/02/2015 M.D.   On: 10/07/2019 16:16   CT HEAD WO CONTRAST  Result Date: 10/07/2019 CLINICAL DATA:  Left-sided weakness.  Transient ischemic attack. EXAM: CT HEAD WITHOUT CONTRAST TECHNIQUE: Contiguous axial images were obtained from the base of the skull through the vertex without intravenous contrast. COMPARISON:  CT head 11/01/2014. FINDINGS: Brain: There is no evidence of acute intracranial hemorrhage, mass lesion, brain edema or extra-axial fluid collection. Old inferior left cerebellar infarct again noted with associated encephalomalacia. The ventricles and subarachnoid spaces are otherwise appropriately size for age. There is mild asymmetric gliosis in the right occipital white matter. There is no CT evidence of acute cortical infarction. Vascular: Intracranial vascular calcifications. No hyperdense vessel identified. Skull: Negative for fracture or focal lesion.  Sinuses/Orbits: The visualized paranasal sinuses and mastoid air cells are clear. No orbital abnormalities are seen. Other: None. IMPRESSION: 1. No acute intracranial findings. 2. Old left cerebellar infarct. Electronically Signed   By: 01/01/2015 M.D.   On: 10/07/2019 13:33   MR BRAIN WO CONTRAST  Result Date: 10/07/2019 CLINICAL DATA:  Left facial droop. Left-sided weakness. Right M2 branch vessel occlusion by CT. EXAM: MRI HEAD WITHOUT CONTRAST TECHNIQUE: Multiplanar, multiecho pulse sequences of the brain and surrounding structures were obtained without intravenous contrast. COMPARISON:  CT studies earlier same day.  MRI 10/30/2014. FINDINGS: Brain: Diffusion imaging shows a few scattered small patchy areas of infarction in the right MCA territory including in the insula, lateral temporal lobe, and parietal deep and subcortical white matter. No large confluent infarction. Old infarction affects the left cerebellum. Cerebral hemispheres otherwise show moderate chronic small-vessel  ischemic changes of the white matter. One can appreciate signal loss in the right MCA branch responsible for the infarction on the susceptibility weighted imaging. No hydrocephalus or extra-axial collection. Vascular: Otherwise the major vessels at the base of the brain show flow. Skull and upper cervical spine: Negative Sinuses/Orbits: Clear/normal Other: None IMPRESSION: Few small foci of scattered patchy infarction in the right MCA territory as noted above, most notable along the deep insular cortex, right lateral temporal cortex, and right parietal deep and subcortical white matter. No large confluent acute infarction. No mass effect or evidence of parenchymal hemorrhage. One can appreciate signal loss associated with the occluded right MCA branch in the insula on the susceptibility weighted imaging. Old left cerebellar infarction. Chronic small-vessel ischemic changes of the cerebral hemispheric white matter. Electronically  Signed   By: Paulina Fusi M.D.   On: 10/07/2019 15:59   CT CEREBRAL PERFUSION W CONTRAST  Result Date: 10/07/2019 CLINICAL DATA:  Left-sided weakness EXAM: CT ANGIOGRAPHY HEAD AND NECK CT PERFUSION BRAIN TECHNIQUE: Multidetector CT imaging of the head and neck was performed using the standard protocol during bolus administration of intravenous contrast. Multiplanar CT image reconstructions and MIPs were obtained to evaluate the vascular anatomy. Carotid stenosis measurements (when applicable) are obtained utilizing NASCET criteria, using the distal internal carotid diameter as the denominator. Multiphase CT imaging of the brain was performed following IV bolus contrast injection. Subsequent parametric perfusion maps were calculated using RAPID software. CONTRAST:  80mL OMNIPAQUE IOHEXOL 350 MG/ML SOLN COMPARISON:  None. FINDINGS: CTA NECK FINDINGS Aortic arch: Great vessel origins are patent. Right carotid system: Patent. Partially retropharyngeal course of the ICA. No measurable stenosis. Left carotid system: Patent. Partially retropharyngeal course of the ICA. No measurable stenosis. Vertebral arteries: Patent and codominant. Skeleton: Cervical spine degenerative changes. Other neck: No mass or adenopathy. Upper chest: Included upper lungs are clear. Review of the MIP images confirms the above findings CTA HEAD FINDINGS Anterior circulation: Intracranial internal carotid arteries are patent with mild calcified plaque. There is a 1 mm inferiorly directed outpouching of the distal supraclinoid left ICA. Appears to be a diminutive branch arising from this area. Right M1 MCA is patent. There is occlusion of a proximal right M2 MCA branch with some reconstitution more distally. Left middle and both anterior cerebral arteries are patent. An anterior communicating artery is present. Posterior circulation: Intracranial vertebral arteries, basilar artery, and posterior cerebral arteries are patent Venous sinuses: As  permitted by contrast timing, patent. Review of the MIP images confirms the above findings CT Brain Perfusion Findings: ASPECTS: 10 CBF (<30%) Volume: 0mL Perfusion (Tmax>6.0s) volume: 33mL Mismatch Volume: 33mL Infarction Location:Posterior right MCA territory. IMPRESSION: There is occlusion of a proximal right M2 MCA branch with some reconstitution more distally. Perfusion imaging demonstrates no evidence of core infarction. However, there is 33 mL of penumbra identified in the posterior right MCA territory. No measurable stenosis in the neck. 1 mm outpouching of the distal supraclinoid left ICA likely reflecting an infundibulum. These results were called by telephone at the time of interpretation on 10/07/2019 at 1:51 pm to provider Dr. Iver Nestle, who verbally acknowledged these results. Electronically Signed   By: Guadlupe Spanish M.D.   On: 10/07/2019 14:01   ECHOCARDIOGRAM COMPLETE  Result Date: 10/08/2019    ECHOCARDIOGRAM REPORT   Patient Name:   ITSEL OPFER Date of Exam: 10/08/2019 Medical Rec #:  [id number]    Height: Accession #:    [id number]   Weight: Date of Birth:  1945/07/27    BSA: Patient Age:    74 years     BP:           129/58 mmHg Patient Gender: F            HR:           80 bpm. Exam Location:  Inpatient Procedure: 2D Echo Indications:   stroke 434.91  History:       Patient has no prior history of Echocardiogram examinations.                Stroke.  Sonographer:   Delcie Roch Referring      [id number] DAVID R Albion Weatherholtz Phys: IMPRESSIONS  1. Left ventricular ejection fraction, by estimation, is 60 to 65%. The left ventricle has normal function. The left ventricle has no regional wall motion abnormalities. Left ventricular diastolic parameters are consistent with Grade II diastolic dysfunction (pseudonormalization). Elevated left atrial pressure.  2. Right ventricular systolic function is normal. The right ventricular size is normal.  3. The mitral valve is normal in structure. No evidence of mitral  valve regurgitation. No evidence of mitral stenosis.  4. The aortic valve is normal in structure. Aortic valve regurgitation is not visualized. No aortic stenosis is present.  5. The inferior vena cava is normal in size with greater than 50% respiratory variability, suggesting right atrial pressure of 3 mmHg. FINDINGS  Left Ventricle: Left ventricular ejection fraction, by estimation, is 60 to 65%. The left ventricle has normal function. The left ventricle has no regional wall motion abnormalities. The left ventricular internal cavity size was normal in size. There is  no left ventricular hypertrophy. Left ventricular diastolic parameters are consistent with Grade II diastolic dysfunction (pseudonormalization). Elevated left atrial pressure. Right Ventricle: The right ventricular size is normal. No increase in right ventricular wall thickness. Right ventricular systolic function is normal. Left Atrium: Left atrial size was normal in size. Right Atrium: Right atrial size was normal in size. Pericardium: There is no evidence of pericardial effusion. Mitral Valve: The mitral valve is normal in structure. Normal mobility of the mitral valve leaflets. No evidence of mitral valve regurgitation. No evidence of mitral valve stenosis. Tricuspid Valve: The tricuspid valve is normal in structure. Tricuspid valve regurgitation is not demonstrated. No evidence of tricuspid stenosis. Aortic Valve: The aortic valve is normal in structure. Aortic valve regurgitation is not visualized. No aortic stenosis is present. Pulmonic Valve: The pulmonic valve was normal in structure. Pulmonic valve regurgitation is not visualized. No evidence of pulmonic stenosis. Aorta: The aortic root is normal in size and structure. Venous: The inferior vena cava is normal in size with greater than 50% respiratory variability, suggesting right atrial pressure of 3 mmHg. IAS/Shunts: No atrial level shunt detected by color flow Doppler.  LEFT VENTRICLE PLAX  2D LVIDd:         3.90 cm  Diastology LVIDs:         2.80 cm  LV e' lateral:   10.40 cm/s LV PW:         0.90 cm  LV E/e' lateral: 11.3 LV IVS:        0.70 cm  LV e' medial:    7.40 cm/s LVOT diam:     1.80 cm  LV E/e' medial:  15.9 LV SV:         64 LVOT Area:     2.54 cm  RIGHT VENTRICLE  IVC RV S prime:     17.10 cm/s  IVC diam: 1.20 cm TAPSE (M-mode): 2.5 cm LEFT ATRIUM             RIGHT ATRIUM LA diam:        3.30 cm RA Area:     10.30 cm LA Vol (A2C):   38.4 ml RA Volume:   21.30 ml LA Vol (A4C):   44.2 ml LA Biplane Vol: 42.5 ml  AORTIC VALVE LVOT Vmax:   132.00 cm/s LVOT Vmean:  79.200 cm/s LVOT VTI:    0.250 m  AORTA Ao Root diam: 3.10 cm Ao Asc diam:  3.00 cm MITRAL VALVE MV Area (PHT): 3.65 cm     SHUNTS MV Decel Time: 208 msec     Systemic VTI:  0.25 m MV E velocity: 118.00 cm/s  Systemic Diam: 1.80 cm MV A velocity: 88.60 cm/s MV E/A ratio:  1.33 Mihai Croitoru MD Electronically signed by Thurmon FairMihai Croitoru MD Signature Date/Time: 10/08/2019/11:42:35 AM    Final    CT ANGIO HEAD CODE STROKE  Result Date: 10/07/2019 CLINICAL DATA:  Left-sided weakness EXAM: CT ANGIOGRAPHY HEAD AND NECK CT PERFUSION BRAIN TECHNIQUE: Multidetector CT imaging of the head and neck was performed using the standard protocol during bolus administration of intravenous contrast. Multiplanar CT image reconstructions and MIPs were obtained to evaluate the vascular anatomy. Carotid stenosis measurements (when applicable) are obtained utilizing NASCET criteria, using the distal internal carotid diameter as the denominator. Multiphase CT imaging of the brain was performed following IV bolus contrast injection. Subsequent parametric perfusion maps were calculated using RAPID software. CONTRAST:  80mL OMNIPAQUE IOHEXOL 350 MG/ML SOLN COMPARISON:  None. FINDINGS: CTA NECK FINDINGS Aortic arch: Great vessel origins are patent. Right carotid system: Patent. Partially retropharyngeal course of the ICA. No measurable stenosis.  Left carotid system: Patent. Partially retropharyngeal course of the ICA. No measurable stenosis. Vertebral arteries: Patent and codominant. Skeleton: Cervical spine degenerative changes. Other neck: No mass or adenopathy. Upper chest: Included upper lungs are clear. Review of the MIP images confirms the above findings CTA HEAD FINDINGS Anterior circulation: Intracranial internal carotid arteries are patent with mild calcified plaque. There is a 1 mm inferiorly directed outpouching of the distal supraclinoid left ICA. Appears to be a diminutive branch arising from this area. Right M1 MCA is patent. There is occlusion of a proximal right M2 MCA branch with some reconstitution more distally. Left middle and both anterior cerebral arteries are patent. An anterior communicating artery is present. Posterior circulation: Intracranial vertebral arteries, basilar artery, and posterior cerebral arteries are patent Venous sinuses: As permitted by contrast timing, patent. Review of the MIP images confirms the above findings CT Brain Perfusion Findings: ASPECTS: 10 CBF (<30%) Volume: 0mL Perfusion (Tmax>6.0s) volume: 33mL Mismatch Volume: 33mL Infarction Location:Posterior right MCA territory. IMPRESSION: There is occlusion of a proximal right M2 MCA branch with some reconstitution more distally. Perfusion imaging demonstrates no evidence of core infarction. However, there is 33 mL of penumbra identified in the posterior right MCA territory. No measurable stenosis in the neck. 1 mm outpouching of the distal supraclinoid left ICA likely reflecting an infundibulum. These results were called by telephone at the time of interpretation on 10/07/2019 at 1:51 pm to provider Dr. Iver NestleBhagat, who verbally acknowledged these results. Electronically Signed   By: Guadlupe SpanishPraneil  Patel M.D.   On: 10/07/2019 14:01   CT ANGIO NECK CODE STROKE  Result Date: 10/07/2019 CLINICAL DATA:  Left-sided weakness EXAM: CT ANGIOGRAPHY HEAD  AND NECK CT PERFUSION  BRAIN TECHNIQUE: Multidetector CT imaging of the head and neck was performed using the standard protocol during bolus administration of intravenous contrast. Multiplanar CT image reconstructions and MIPs were obtained to evaluate the vascular anatomy. Carotid stenosis measurements (when applicable) are obtained utilizing NASCET criteria, using the distal internal carotid diameter as the denominator. Multiphase CT imaging of the brain was performed following IV bolus contrast injection. Subsequent parametric perfusion maps were calculated using RAPID software. CONTRAST:  80mL OMNIPAQUE IOHEXOL 350 MG/ML SOLN COMPARISON:  None. FINDINGS: CTA NECK FINDINGS Aortic arch: Great vessel origins are patent. Right carotid system: Patent. Partially retropharyngeal course of the ICA. No measurable stenosis. Left carotid system: Patent. Partially retropharyngeal course of the ICA. No measurable stenosis. Vertebral arteries: Patent and codominant. Skeleton: Cervical spine degenerative changes. Other neck: No mass or adenopathy. Upper chest: Included upper lungs are clear. Review of the MIP images confirms the above findings CTA HEAD FINDINGS Anterior circulation: Intracranial internal carotid arteries are patent with mild calcified plaque. There is a 1 mm inferiorly directed outpouching of the distal supraclinoid left ICA. Appears to be a diminutive branch arising from this area. Right M1 MCA is patent. There is occlusion of a proximal right M2 MCA branch with some reconstitution more distally. Left middle and both anterior cerebral arteries are patent. An anterior communicating artery is present. Posterior circulation: Intracranial vertebral arteries, basilar artery, and posterior cerebral arteries are patent Venous sinuses: As permitted by contrast timing, patent. Review of the MIP images confirms the above findings CT Brain Perfusion Findings: ASPECTS: 10 CBF (<30%) Volume: 0mL Perfusion (Tmax>6.0s) volume: 33mL Mismatch  Volume: 33mL Infarction Location:Posterior right MCA territory. IMPRESSION: There is occlusion of a proximal right M2 MCA branch with some reconstitution more distally. Perfusion imaging demonstrates no evidence of core infarction. However, there is 33 mL of penumbra identified in the posterior right MCA territory. No measurable stenosis in the neck. 1 mm outpouching of the distal supraclinoid left ICA likely reflecting an infundibulum. These results were called by telephone at the time of interpretation on 10/07/2019 at 1:51 pm to provider Dr. Iver Nestle, who verbally acknowledged these results. Electronically Signed   By: Guadlupe Spanish M.D.   On: 10/07/2019 14:01    EKG: Independently reviewed.  Atrial fibrillation with RVR 145 bpm  Time spent: >45 minutes  Kayson Bullis A Katrinka Blazing [facility] Pager [phone]  If 7PM-7AM, please contact night-coverage [email or web address] Password [facility] 10/09/2019, 11:39 AM

## 2019-10-10 DIAGNOSIS — I10 Essential (primary) hypertension: Secondary | ICD-10-CM

## 2019-10-10 DIAGNOSIS — L259 Unspecified contact dermatitis, unspecified cause: Secondary | ICD-10-CM

## 2019-10-10 DIAGNOSIS — I493 Ventricular premature depolarization: Secondary | ICD-10-CM

## 2019-10-10 LAB — BASIC METABOLIC PANEL
Anion gap: 11 (ref 5–15)
BUN: 19 mg/dL (ref 8–23)
CO2: 23 mmol/L (ref 22–32)
Calcium: 8.8 mg/dL — ABNORMAL LOW (ref 8.9–10.3)
Chloride: 103 mmol/L (ref 98–111)
Creatinine, Ser: 1.18 mg/dL — ABNORMAL HIGH (ref 0.44–1.00)
GFR calc Af Amer: 52 mL/min — ABNORMAL LOW (ref >60)
GFR calc non Af Amer: 45 mL/min — ABNORMAL LOW (ref >60)
Glucose, Bld: 121 mg/dL — ABNORMAL HIGH (ref 70–99)
Potassium: 3.1 mmol/L — ABNORMAL LOW (ref 3.5–5.1)
Sodium: 137 mmol/L (ref 135–145)

## 2019-10-10 LAB — URINALYSIS, ROUTINE W REFLEX MICROSCOPIC
Bilirubin Urine: NEGATIVE
Glucose, UA: NEGATIVE mg/dL
Hgb urine dipstick: NEGATIVE
Ketones, ur: NEGATIVE mg/dL
Nitrite: NEGATIVE
Protein, ur: NEGATIVE mg/dL
Specific Gravity, Urine: 1.017 (ref 1.005–1.030)
Trans Epithel, UA: 2
WBC, UA: >50 WBC/hpf — ABNORMAL HIGH (ref 0–5)
pH: 6 (ref 5.0–8.0)

## 2019-10-10 LAB — CBC
HCT: 38.4 % (ref 36.0–46.0)
Hemoglobin: 13.1 g/dL (ref 12.0–15.0)
MCH: 30.3 pg (ref 26.0–34.0)
MCHC: 34.1 g/dL (ref 30.0–36.0)
MCV: 88.9 fL (ref 80.0–100.0)
Platelets: 290 10*3/uL (ref 150–400)
RBC: 4.32 MIL/uL (ref 3.87–5.11)
RDW: 14.4 % (ref 11.5–15.5)
WBC: 26.4 10*3/uL — ABNORMAL HIGH (ref 4.0–10.5)
nRBC: 0 % (ref 0.0–0.2)

## 2019-10-10 MED ORDER — POTASSIUM CHLORIDE 20 MEQ PO PACK
40.0000 meq | PACK | ORAL | Status: AC
  Administered 2019-10-10 (×2): 40 meq via ORAL
  Filled 2019-10-10 (×2): qty 2

## 2019-10-10 MED ORDER — HYDROCORTISONE 1 % EX CREA
TOPICAL_CREAM | Freq: Three times a day (TID) | CUTANEOUS | Status: DC
  Filled 2019-10-10: qty 28.35
  Filled 2019-10-10: qty 28

## 2019-10-10 MED ORDER — METOPROLOL TARTRATE 50 MG PO TABS
50.0000 mg | ORAL_TABLET | Freq: Two times a day (BID) | ORAL | Status: DC
  Administered 2019-10-10 – 2019-10-12 (×4): 50 mg via ORAL
  Filled 2019-10-10 (×4): qty 1

## 2019-10-10 MED ORDER — POTASSIUM CHLORIDE 20 MEQ PO PACK
40.0000 meq | PACK | Freq: Two times a day (BID) | ORAL | Status: DC
  Administered 2019-10-10: 40 meq via ORAL
  Filled 2019-10-10: qty 2

## 2019-10-10 MED ORDER — APIXABAN 5 MG PO TABS
5.0000 mg | ORAL_TABLET | Freq: Two times a day (BID) | ORAL | Status: DC
  Administered 2019-10-11 – 2019-10-12 (×3): 5 mg via ORAL
  Filled 2019-10-10 (×3): qty 1

## 2019-10-10 MED ORDER — DIPHENHYDRAMINE HCL 25 MG PO CAPS
25.0000 mg | ORAL_CAPSULE | Freq: Three times a day (TID) | ORAL | Status: DC
  Administered 2019-10-10 – 2019-10-12 (×7): 25 mg via ORAL
  Filled 2019-10-10 (×7): qty 1

## 2019-10-10 NOTE — Progress Notes (Signed)
Physical Therapy Treatment Patient Details Name: Alyssa Small MRN: [id number] DOB: 13-Nov-1945 Today's Date: 10/10/2019    History of Present Illness Pt is a 74 y/o female with PMH of CVA, HTN presenting to ED with facial droop.  In ED initally resolved but noted L sided symptoms again in triage, which resolved again by CT. CT negative, old L cerebellar infarct. CTA reveals M2 MCA branch proximal occlusion. MRI reveals scattered patchy inarction in the R MCA, most notable along the deep insular cortex, right lateral temporal cortex, and right parietal deep and subcortical white matter.    PT Comments    Pt progressing well with mobility. She required supervision bed mobility, min guard assist transfers, and min guard assist ambulation 100' with RW. Current POC remains appropriate.    Follow Up Recommendations  Home health PT;Supervision/Assistance - 24 hour     Equipment Recommendations  None recommended by PT    Recommendations for Other Services       Precautions / Restrictions Precautions Precautions: Fall Precaution Comments: red/inflamation/rash t/o body    Mobility  Bed Mobility Overal bed mobility: Needs Assistance Bed Mobility: Supine to Sit     Supine to sit: Supervision;HOB elevated     General bed mobility comments: +rail, supervision for safety  Transfers Overall transfer level: Needs assistance Equipment used: Rolling walker (2 wheeled) Transfers: Sit to/from Stand Sit to Stand: Min guard         General transfer comment: min guard for safety  Ambulation/Gait Ambulation/Gait assistance: Min guard Gait Distance (Feet): 100 Feet Assistive device: Rolling walker (2 wheeled) Gait Pattern/deviations: Step-through pattern;Decreased stride length Gait velocity: decreased Gait velocity interpretation: <1.31 ft/sec, indicative of household ambulator General Gait Details: fatigues quickly. No LOB noted.   Stairs             Wheelchair Mobility     Modified Rankin (Stroke Patients Only)       Balance Overall balance assessment: Needs assistance Sitting-balance support: No upper extremity supported;Feet supported Sitting balance-Leahy Scale: Good     Standing balance support: Bilateral upper extremity supported;During functional activity   Standing balance comment: relies on BUE support dynamically                            Cognition Arousal/Alertness: Awake/alert Behavior During Therapy: WFL for tasks assessed/performed Overall Cognitive Status: No family/caregiver present to determine baseline cognitive functioning Area of Impairment: Problem solving;Memory                     Memory: Decreased short-term memory       Problem Solving: Slow processing;Difficulty sequencing;Requires verbal cues        Exercises      General Comments        Pertinent Vitals/Pain Pain Assessment: No/denies pain    Home Living                      Prior Function            PT Goals (current goals can now be found in the care plan section) Acute Rehab PT Goals Patient Stated Goal: to get home  Progress towards PT goals: Progressing toward goals    Frequency    Min 4X/week      PT Plan Current plan remains appropriate    Co-evaluation              AM-PAC PT "6 Clicks" Mobility  Outcome Measure  Help needed turning from your back to your side while in a flat bed without using bedrails?: None Help needed moving from lying on your back to sitting on the side of a flat bed without using bedrails?: A Little Help needed moving to and from a bed to a chair (including a wheelchair)?: A Little Help needed standing up from a chair using your arms (e.g., wheelchair or bedside chair)?: A Little Help needed to walk in hospital room?: A Little Help needed climbing 3-5 steps with a railing? : A Lot 6 Click Score: 18    End of Session Equipment Utilized During Treatment: Gait  belt Activity Tolerance: Patient tolerated treatment well Patient left: in chair;with call bell/phone within reach;with chair alarm set Nurse Communication: Mobility status PT Visit Diagnosis: Unsteadiness on feet (R26.81);Difficulty in walking, not elsewhere classified (R26.2)     Time: 4034-7425 PT Time Calculation (min) (ACUTE ONLY): 19 min  Charges:  $Gait Training: 8-22 mins                     Aida Raider, PT  Office # [phone] Pager [phone]    Ilda Foil 10/10/2019, 1:40 PM

## 2019-10-10 NOTE — Progress Notes (Signed)
  Tele called to notify that had a run of Trigeminy PVCs earlier today.  Was asymptomatic and sitting up in bed laughing with family at the time of the call.  No signs of distress.  Notified Dr Roda Shutters by Loretha Stapler message.

## 2019-10-10 NOTE — Progress Notes (Addendum)
Progress Note  Patient Name: Alyssa Small Date of Encounter: 10/10/2019  [facility] Cardiologist: New   Subjective    Patient converted to sinus overnight. WBC trending down. Overall rash is better. Patient believes reaction could have been from detergent, which has happened before. No chest pain, breathing stable.   Inpatient Medications    Scheduled Meds: .  stroke: mapping our early stages of recovery book   Does not apply Once  . aspirin EC  325 mg Oral Daily  . citalopram  20 mg Oral Daily  . diphenhydrAMINE  25 mg Oral TID PC  . hydrocortisone cream   Topical TID  . levothyroxine  75 mcg Oral q morning - 10a  . metoprolol tartrate  25 mg Oral Q6H  . pantoprazole  40 mg Oral Daily  . potassium chloride  40 mEq Oral Q4H  . simvastatin  40 mg Oral QHS  . traZODone  50 mg Oral QHS   Continuous Infusions:  PRN Meds: acetaminophen **OR** acetaminophen (TYLENOL) oral liquid 160 mg/5 mL **OR** acetaminophen, albuterol, fluticasone, senna-docusate   Vital Signs    Vitals:   10/09/19 2037 10/10/19 0005 10/10/19 0332 10/10/19 0803  BP: (!) 105/34 (!) 118/50 (!) 112/57 (!) 116/52  Pulse: 84 97 89 88  Resp: 20 (!) 23 (!) 25 19  Temp: 98.3 F (36.8 C) 99.7 F (37.6 C) 100.2 F (37.9 C) 98.1 F (36.7 C)  TempSrc: Other (Comment) Oral Oral Oral  SpO2: 100% 99% 96% 98%    Intake/Output Summary (Last 24 hours) at 10/10/2019 1034 Last data filed at 10/09/2019 1709 Gross per 24 hour  Intake 0 ml  Output --  Net 0 ml   No flowsheet data found.    Telemetry    Afib>NSR overnight, had Vbigeminy that improved, now occasional PVCs - Personally Reviewed  ECG    pending - Personally Reviewed  Physical Exam   GEN: No acute distress.   Neck: No JVD Cardiac: RRR, no murmurs, rubs, or gallops.  Respiratory: Clear to auscultation bilaterally. GI: Soft, nontender, non-distended  MS: No edema; No deformity. Neuro:  Nonfocal  Psych: Normal affect   Labs    High  Sensitivity Troponin:  No results for input(s): TROPONINIHS in the last 720 hours.    Chemistry Recent Labs  Lab 10/07/19 1301 10/07/19 1301 10/07/19 1309 10/09/19 0441 10/10/19 0145  NA 140   < > 142 139 137  K 4.0   < > 3.5 4.1 3.1*  CL 104   < > 103 102 103  CO2 25  --   --  25 23  GLUCOSE 107*   < > 106* 125* 121*  BUN 9   < > 10 12 19   CREATININE 0.94   < > 0.90 0.93 1.18*  CALCIUM 9.4  --   --  9.1 8.8*  PROT 7.1  --   --   --   --   ALBUMIN 3.6  --   --   --   --   AST 25  --   --   --   --   ALT 12  --   --   --   --   ALKPHOS 80  --   --   --   --   BILITOT 0.9  --   --   --   --   GFRNONAA 59*  --   --  >60 45*  GFRAA >60  --   --  >60  52*  ANIONGAP 11  --   --  12 11   < > = values in this interval not displayed.     Hematology Recent Labs  Lab 10/09/19 0441 10/09/19 1222 10/10/19 0145  WBC 24.3* 27.5* 26.4*  RBC 4.79 4.73 4.32  HGB 14.5 14.1 13.1  HCT 43.0 42.5 38.4  MCV 89.8 89.9 88.9  MCH 30.3 29.8 30.3  MCHC 33.7 33.2 34.1  RDW 14.4 14.5 14.4  PLT 333 342 290    BNPNo results for input(s): BNP, PROBNP in the last 168 hours.   DDimer No results for input(s): DDIMER in the last 168 hours.   Radiology    DG Chest 2 View  Result Date: 10/09/2019 CLINICAL DATA:  Stroke, rash. EXAM: CHEST - 2 VIEW COMPARISON:  10/07/2019. FINDINGS: Trachea is midline. Heart size within normal limits. Streaky atelectasis or scarring in the left lower lobe. Lungs are otherwise clear. No pleural fluid. Extensive osteophytosis in the thoracic spine. IMPRESSION: Streaky atelectasis or scarring in the left lower lobe. No acute findings. Electronically Signed   By: Leanna Battles M.D.   On: 10/09/2019 10:07   ECHOCARDIOGRAM COMPLETE  Result Date: 10/08/2019    ECHOCARDIOGRAM REPORT   Patient Name:   Alyssa Small Date of Exam: 10/08/2019 Medical Rec #:  [id number]    Height: Accession #:    [id number]   Weight: Date of Birth:  04/04/1945    BSA: Patient Age:    74 years      BP:           129/58 mmHg Patient Gender: F            HR:           80 bpm. Exam Location:  Inpatient Procedure: 2D Echo Indications:   stroke 434.91  History:       Patient has no prior history of Echocardiogram examinations.                Stroke.  Sonographer:   Delcie Roch Referring      [phone] DAVID R SMITH Phys: IMPRESSIONS  1. Left ventricular ejection fraction, by estimation, is 60 to 65%. The left ventricle has normal function. The left ventricle has no regional wall motion abnormalities. Left ventricular diastolic parameters are consistent with Grade II diastolic dysfunction (pseudonormalization). Elevated left atrial pressure.  2. Right ventricular systolic function is normal. The right ventricular size is normal.  3. The mitral valve is normal in structure. No evidence of mitral valve regurgitation. No evidence of mitral stenosis.  4. The aortic valve is normal in structure. Aortic valve regurgitation is not visualized. No aortic stenosis is present.  5. The inferior vena cava is normal in size with greater than 50% respiratory variability, suggesting right atrial pressure of 3 mmHg. FINDINGS  Left Ventricle: Left ventricular ejection fraction, by estimation, is 60 to 65%. The left ventricle has normal function. The left ventricle has no regional wall motion abnormalities. The left ventricular internal cavity size was normal in size. There is  no left ventricular hypertrophy. Left ventricular diastolic parameters are consistent with Grade II diastolic dysfunction (pseudonormalization). Elevated left atrial pressure. Right Ventricle: The right ventricular size is normal. No increase in right ventricular wall thickness. Right ventricular systolic function is normal. Left Atrium: Left atrial size was normal in size. Right Atrium: Right atrial size was normal in size. Pericardium: There is no evidence of pericardial effusion. Mitral Valve: The mitral valve is normal in structure. Normal  mobility of the  mitral valve leaflets. No evidence of mitral valve regurgitation. No evidence of mitral valve stenosis. Tricuspid Valve: The tricuspid valve is normal in structure. Tricuspid valve regurgitation is not demonstrated. No evidence of tricuspid stenosis. Aortic Valve: The aortic valve is normal in structure. Aortic valve regurgitation is not visualized. No aortic stenosis is present. Pulmonic Valve: The pulmonic valve was normal in structure. Pulmonic valve regurgitation is not visualized. No evidence of pulmonic stenosis. Aorta: The aortic root is normal in size and structure. Venous: The inferior vena cava is normal in size with greater than 50% respiratory variability, suggesting right atrial pressure of 3 mmHg. IAS/Shunts: No atrial level shunt detected by color flow Doppler.  LEFT VENTRICLE PLAX 2D LVIDd:         3.90 cm  Diastology LVIDs:         2.80 cm  LV e' lateral:   10.40 cm/s LV PW:         0.90 cm  LV E/e' lateral: 11.3 LV IVS:        0.70 cm  LV e' medial:    7.40 cm/s LVOT diam:     1.80 cm  LV E/e' medial:  15.9 LV SV:         64 LVOT Area:     2.54 cm  RIGHT VENTRICLE             IVC RV S prime:     17.10 cm/s  IVC diam: 1.20 cm TAPSE (M-mode): 2.5 cm LEFT ATRIUM             RIGHT ATRIUM LA diam:        3.30 cm RA Area:     10.30 cm LA Vol (A2C):   38.4 ml RA Volume:   21.30 ml LA Vol (A4C):   44.2 ml LA Biplane Vol: 42.5 ml  AORTIC VALVE LVOT Vmax:   132.00 cm/s LVOT Vmean:  79.200 cm/s LVOT VTI:    0.250 m  AORTA Ao Root diam: 3.10 cm Ao Asc diam:  3.00 cm MITRAL VALVE MV Area (PHT): 3.65 cm     SHUNTS MV Decel Time: 208 msec     Systemic VTI:  0.25 m MV E velocity: 118.00 cm/s  Systemic Diam: 1.80 cm MV A velocity: 88.60 cm/s MV E/A ratio:  1.33 Mihai Croitoru MD Electronically signed by Thurmon Fair MD Signature Date/Time: 10/08/2019/11:42:35 AM    Final     Cardiac Studies   TTE 10/08/2019 1. Left ventricular ejection fraction, by estimation, is 60 to 65%. The  left ventricle has normal  function. The left ventricle has no regional  wall motion abnormalities. Left ventricular diastolic parameters are  consistent with Grade II diastolic  dysfunction (pseudonormalization). Elevated left atrial pressure.  2. Right ventricular systolic function is normal. The right ventricular  size is normal.  3. The mitral valve is normal in structure. No evidence of mitral valve  regurgitation. No evidence of mitral stenosis.  4. The aortic valve is normal in structure. Aortic valve regurgitation is  not visualized. No aortic stenosis is present.  5. The inferior vena cava is normal in size with greater than 50%  respiratory variability, suggesting right atrial pressure of 3 mmHg.   Patient Profile     74 y.o. female with pmh of prior CVA admitted with CVA and cardiology consulted 8/10 for afib RVR.   Assessment & Plan    New onset Afib RVR - Afib possibly triggered by  systemic event. Afib followed by systemic rash and leukocytosis. IM consulted for this, ?possibly allergic reaction to detergent - Echo showed preserved EF - TSH normal - Plan to start Eliquis when ok by neuro - Metoprolol 25mg Q6 hours>>will consolidate. BP 116/52 - Patient converted to sinus overnight.   Rash with leukocytosis - unclear etiology, no new drugs to explain rash - WBC up to 26 - BC with no growth - Fever max 100.56F - Benadryl added  CVA - MRI showed small scattered infarcts - per neurology - ASA and statin  For questions or updates, please contact [facility] Please consult [email or web address] for contact info under        Signed, Cadence David StallH Furth, PA-C  10/10/2019, 10:34 AM    History and all data above reviewed.  Patient examined.  I agree with the findings as above.  the patient feels well and does not report any residual issues with her stroke.  The rash is slowly improving.  The patient exam reveals COR:RRR  ,  Lungs: Clear  ,  Abd: Positive bowel sounds, no rebound no guarding, Ext no  edema  .  All available labs, radiology testing, previous records reviewed. Agree with documented assessment and plan.   Atrial fib:  Discussed with Dr. Roda ShuttersXu and we will start Eliquis in the AM.    Rollene RotundaJames Khara Renaud  3:47 PM  10/10/2019

## 2019-10-10 NOTE — Progress Notes (Signed)
PROGRESS NOTE    Alyssa Small  CVE:[id number] DOB: 1945/12/30 DOA: 10/07/2019 PCP: Patient, No Pcp Per    Brief Narrative:  Reason for consultation: Low-grade fever, leukocytosis and rash.  74 year old female with past medical history of hypertension, CVA, arthritis and GERD who was admitted to the neurology service on 8/9 with a working diagnosis of acute cardioembolic right MCA CVA.  Patient was noted to have an acute left facial droop and was brought to the hospital by her family.  On her initial physical examination she had left-sided neglect, her blood pressure was 163/74, pulse rate 76, temperature 98.8, respirate 19, oxygen saturation 98%.  Further work-up with brain MRI showed small scattered infarcts along the right MCA territory. While on telemetry monitoring on 8/10 she developed atrial fibrillation with rapid ventricular response.  Her heart rate was controlled with AV blockade with metoprolol, and she was placed on full anticoagulation with apixaban.  On August 11 patient had a temperature of 100.3 F and noted to have a small left-sided ulceration at the lower lip on the left.  Assessment & Plan:   Active Problems:   Stroke (cerebrum) (HCC)   Atrial fibrillation with RVR (HCC)   Contact dermatitis and eczema  1. Contact dermatitis with eczema with reactive leukocytosis Patient has macular erythematous rash predominantly back, chest and upper thighs bilaterally. Positive pruritus but no skin rupture or purulence. Wbc is 26,4. Cultures with no growth. T max 100.2 F . No mucosal involvement.   She had similar symptoms in a prior hospitalization that improved with benadryl and using her own gown and bedsheets. (She will ask her family to bring her own clothing)  Will add oral benadryl 25 mg tid and local steroid cream (hydrocortisone 0,5 mg tid), continue to hold on antibiotic therapy, no clinical signs of bacterial infection. No urinary symptoms, dc UA. Follow cell count in am.     2. Hypokalemia. Renal function stable with serum cr at 1,18, with serum K at 3,1 and bicarbonate at 23.  Will add 80 meq Kcl in 2 dived doses and will follow up on renal panel in am, patient is tolerating po well. Check Mg.   3. Acute cardio-embolic right CVA/ right MCA territory. Patient with no focal weakness this am. Continue anticoagulation with apixaban.   On asa full dose and simvastatin 40 mg daily.   Follow with PT/OT recommendations. Primary team Neurology.   4. New onset atrial fibrillation. Continue rate control with metoprolol 25 mg q 6 H and anticoagulation with apixaban.   Echocardiogram with EF 60 to 65%, on the LV. No embolic source identified.   5, Hypothyroid. Continue levothyroxine.   6. Depression. Continue with citalopram.   Status is: Inpatient  Remains inpatient appropriate because:Inpatient level of care appropriate due to severity of illness   Dispo: The patient is from: Home              Anticipated d/c is to: Home              Anticipated d/c date is: 2 days              Patient currently is not medically stable to d/c.   DVT prophylaxis: apixaban   Code Status:   full  Family Communication:  No family at the bedside      Subjective: Patient continue to have pruriginous skin lesions, had this symptoms before in a prior hospitalization, improved with using her own clothing. No nausea or vomiting, no  dyspnea or chest pain   Objective: Vitals:   10/09/19 2037 10/10/19 0005 10/10/19 0332 10/10/19 0803  BP: (!) 105/34 (!) 118/50 (!) 112/57 (!) 116/52  Pulse: 84 97 89 88  Resp: 20 (!) 23 (!) 25 19  Temp: 98.3 F (36.8 C) 99.7 F (37.6 C) 100.2 F (37.9 C) 98.1 F (36.7 C)  TempSrc: Other (Comment) Oral Oral Oral  SpO2: 100% 99% 96% 98%    Intake/Output Summary (Last 24 hours) at 10/10/2019 4166 Last data filed at 10/09/2019 1709 Gross per 24 hour  Intake 0 ml  Output --  Net 0 ml   There were no vitals filed for this  visit.  Examination:   General: Not in pain or dyspnea  Neurology: Awake and alert, non focal  E ENT: no pallor, no icterus, oral mucosa moist Cardiovascular: No JVD. S1-S2 present, rhythmic, no gallops, rubs, or murmurs. No lower extremity edema. Pulmonary: positive breath sounds bilaterally, adequate air movement, no wheezing, rhonchi or rales. Gastrointestinal. Abdomen soft and non tender Skin. Macular rash upper thighs bilaterally, chest and back, ill defined borders. No skin rupture, no purulence.  Musculoskeletal: no joint deformities     Data Reviewed: I have personally reviewed following labs and imaging studies  CBC: Recent Labs  Lab 10/07/19 1301 10/07/19 1309 10/09/19 0441 10/09/19 1222 10/10/19 0145  WBC 7.0  --  24.3* 27.5* 26.4*  NEUTROABS 4.2  --   --  24.7*  --   HGB 13.6 14.3 14.5 14.1 13.1  HCT 41.2 42.0 43.0 42.5 38.4  MCV 90.9  --  89.8 89.9 88.9  PLT 340  --  333 342 290   Basic Metabolic Panel: Recent Labs  Lab 10/07/19 1301 10/07/19 1309 10/09/19 0441 10/10/19 0145  NA 140 142 139 137  K 4.0 3.5 4.1 3.1*  CL 104 103 102 103  CO2 25  --  25 23  GLUCOSE 107* 106* 125* 121*  BUN 9 10 12 19   CREATININE 0.94 0.90 0.93 1.18*  CALCIUM 9.4  --  9.1 8.8*   GFR: CrCl cannot be calculated (Unknown ideal weight.). Liver Function Tests: Recent Labs  Lab 10/07/19 1301  AST 25  ALT 12  ALKPHOS 80  BILITOT 0.9  PROT 7.1  ALBUMIN 3.6   No results for input(s): LIPASE, AMYLASE in the last 168 hours. No results for input(s): AMMONIA in the last 168 hours. Coagulation Profile: Recent Labs  Lab 10/07/19 1301  INR 1.0   Cardiac Enzymes: No results for input(s): CKTOTAL, CKMB, CKMBINDEX, TROPONINI in the last 168 hours. BNP (last 3 results) No results for input(s): PROBNP in the last 8760 hours. HbA1C: Recent Labs    10/08/19 0433  HGBA1C 5.5   CBG: Recent Labs  Lab 10/07/19 1628  GLUCAP 100*   Lipid Profile: Recent Labs     10/08/19 0433  CHOL 171  HDL 81  LDLCALC 81  TRIG 43  CHOLHDL 2.1   Thyroid Function Tests: Recent Labs    10/09/19 0441  TSH 1.729   Anemia Panel: No results for input(s): VITAMINB12, FOLATE, FERRITIN, TIBC, IRON, RETICCTPCT in the last 72 hours.    Radiology Studies: I have reviewed all of the imaging during this hospital visit personally     Scheduled Meds: .  stroke: mapping our early stages of recovery book   Does not apply Once  . aspirin EC  325 mg Oral Daily  . citalopram  20 mg Oral Daily  . levothyroxine  75  mcg Oral q morning - 10a  . metoprolol tartrate  25 mg Oral Q6H  . pantoprazole  40 mg Oral Daily  . potassium chloride  40 mEq Oral BID  . simvastatin  40 mg Oral QHS  . traZODone  50 mg Oral QHS   Continuous Infusions:   LOS: 3 days        Russell Quinney Annett Gula, MD

## 2019-10-10 NOTE — TOC Benefit Eligibility Note (Signed)
Transition of Care [facility]) Benefit Eligibility Note    Patient Details  Name: Alyssa Small MRN: [id number] Date of Birth: [birth year]   Medication/Dose: Arne Cleveland  5 MG BID  Covered?: Yes  Tier: 3 Drug  Prescription Coverage Preferred [facility]: [facility] [address]  Spoke with Person/Company/Phone Number:: [facility]  @  [employer]  RX #  [phone] OPT- MEMBER  Co-Pay: $ 47.00  Prior Approval: No  Deductible: Met       Memory Argue Phone Number: 10/10/2019, 4:21 PM

## 2019-10-10 NOTE — Progress Notes (Signed)
Tele called again to say pt was having Bigeminy PVCs.  I did page Dr Iver Nestle by Loretha Stapler to report, as Dr Roda Shutters is no longer on call.  No signs of distress.  Resting in bed watching TV and denies chest pain, palpitations, SOB, etc.

## 2019-10-10 NOTE — Progress Notes (Addendum)
STROKE TEAM PROGRESS NOTE   Subjective/Interval History: She is up in her chair with her tray in front of her upon entering the room. No family at bedside. She states that she feels ok with no new neurological symptoms. Her rash is itching more today and appears the same; redness noted to her chest, bilateral arms and legs. Her potassium was supplemented this morning as it was noted to be low at 3.1 on today's morning labs.   Objective: Temp:  [98.1 F (36.7 C)-100.2 F (37.9 C)] 98.1 F (36.7 C) (08/12 0803) Pulse Rate:  [84-97] 88 (08/12 0803) Cardiac Rhythm: Normal sinus rhythm (08/12 0701) Resp:  [16-25] 19 (08/12 0803) BP: (86-118)/(34-87) 116/52 (08/12 0803) SpO2:  [96 %-100 %] 98 % (08/12 0803)  Stroke Lab Work:   10/08/19 Lipid Panel    Component Value Date/Time   CHOL 171 10/08/2019 0433   TRIG 43 10/08/2019 0433   HDL 81 10/08/2019 0433   CHOLHDL 2.1 10/08/2019 0433   VLDL 9 10/08/2019 0433   LDLCALC 81 10/08/2019 0433   Lab Results  Component Value Date   HGBA1C 5.5 10/08/2019   Imaging Studies:  I have personally reviewed the radiological images below and agree with the radiology interpretations.  10/07/19 CT Head No acute intracranial findings. Old left cerebellar stroke.   10/07/19 CT Perfusion  Occlusion of a proximal right M2 branch with some reconstitution more distally.Perfusion imaging demonstrates no evidence of core infarction. However, there is 33 mL of penumbra identified in the posterior right MCA territory. No measurable stenosis in the neck.1 mm outpouching of the distal supraclinoid left ICA likely reflecting an infundibulum. Aspects score is 10.   10/07/19 CT Angio Head and Neck  Neck findings: Great vessel origins are patent. Right carotid system is patient. Partially retropharyngeal course of the ICA. No measurable stenosis. Left carotid system is patent with no measurable stenosis.   Head findings: Anterior circulation: Intracranial internal  carotid arteries are patent with mild calcified plaque. There is a 1 mm inferiorly directed outpouching of the distal supraclinoid left ICA. Appears to be a diminutive branch arising from this area. Right M1 MCA is patent. There is occlusion of a proximal right M2 MCA branch with some reconstitution more distally. Left middle and both anterior cerebral arteries are patent. An anterior communicating artery is present.  Posterior circulation: Intracranial vertebral arteries, basilar artery, and posterior cerebral arteries are patent.   10/07/19 MRI Brain WO Contrast Few small foci of scattered patchy infarction in the right MCA territory as noted above, most notable along the deep insular cortex, right lateral temporal cortex, and right parietal deep and subcortical white matter. No large confluent acute infarction. No mass effect or evidence of parenchymal hemorrhage. One can appreciate signal loss associated with the occluded right MCA branch in the insula on the susceptibility weighted imaging.Old left cerebellar infarction. Chronic small-vessel ischemic changes of the cerebral hemispheric white matter.  10/07/19 CXR 2 View Normal sized heart. Clear lungs with normal vascularity. Thoracic spine degenerative changes. Diffuse osteopenia.  10/08/19 Echocardiogram Complete  Ejection Fraction 60-65 %, left ventricle has normal function, right ventricular systolic function is normal, left atrium normal in size, right atrium normal in size, no evidence of pericardial effusion, no mitral tricuspid or aortic valvular regurgitation noted  10/09/19 CXR 2 View  Trachea is midline. Heart size within normal limits. Streaky atelectasis or scarring in the left lower lobe. Lungs are otherwise clear. No pleural fluid. Extensive osteophytosis in the thoracic spine.  Physical Examination: General: Well nourished, well developed, in no apparent distress Mental status: AAOx4, follows commands  Speech: Fluent with repetition  and naming intact, no dysarthria  Cranial nerves: EOMI, VFF, Face symmetric, shoulder shrug intact  Motor: Strength 5/5 throughout, no drift noted to any extremity  Coordination: No ataxia w/FNF or HTS  Sensation: Intact to light touch throughout  Gait: Deferred  NIHSS: 0   Assessment and Plan: [name] is a 74 y.o. female w/pmh of L PICA Stroke(10/29/14), HTN, HLD, UTI, Depression, Arthritis, Hypothyroidism who presents with a R MCA Stroke that is cardioembolic in the setting of newly diagnosed atrial fibrillation that was found this hospital admission. Her CTA Head and Neck upon admission was notable for a right M2 branch occlusion. She was not a candidate for IVTPA as she was outside of the time window to receive it. Her NIHSS was 1 on admission and given low NIHSS, after discussion with interventional radiology, the decision was made to defer mechanical thrombectomy.  NEURO #R MCA Stroke in the setting of right M2 occlusion, cardioembolic in the setting of newly found atrial fibrillation  Her stroke work is complete at this time. Her MRI revealed right MCA patchy small infarcts. Echocardiogram showed relatively normal heart function and was negative for intracardiac source of stroke. Stroke labs were done including lipid panel w/LDL 81, hemoglobin A1C 5.5 She was originally placed on DAPT with the plan to transition to Eliquis however due to concern for infection/diffuse rash, Plavix was discontinued on 10/09/19 and will hold off on initiating Eliquis until further work up is done for infection + rash.  - Continue Aspirin 81 mg for secondary stroke prevention  - Continue Zocor 40 mg for secondary stroke prevention - Consider initiating Eliquis when infection/rash work up is done   CARDS #Hypertension #Atrial Fibrillation  At home she takes Metoprolol XL 25 mg for blood pressure control. Currently her blood pressure is running normotensive and she was started on Metoprolol 25 Q6 by  cardiology for rate control. This was transitioned to Metoprolol 50 mg BID by Cardiology on 10/10/19. Will continue to trend blood pressure and her rate, will defer to Cardiology for metoprolol adjustments. - Metoprolol 50 mg BID   #Hyperlipidemia She takes Zocor 20 mg at home for HLD, this was increased to 40 mg this admission for better cholesterol control. Her LDL is 81, goal LDL from a stroke prevention standpoint is < 70. She is to continue Zocor 40 mg at discharge.   RENAL #Hypokalemia  Overall chemistries have been stable on 10/10/19 she was noted to have low potassium at 3.1 This was supplemented.  - Continue to trend BMP   ENDO #Hypothyroidism   Continue with home Levothyroxine 75 mcg daily.   GI #Acute dysphagia  Passed swallow evaluation with speech on 10/08/19. They recommended a NDD3 diet with thin liquids given dysphagia   ID  #Leukocytosis  Her WBC count jumped from 7.0 ( noted on admission CBC 10/07/19) to 24.3 as of 10/09/19. It remains elevated on 10/10/19 at 26.4. She fevered on 8/10 with temperature noted to be 100.3. She also had a low grade fever on 10/10/19 at 100.2.   Infectious work up was initiated and the hospitalist team was consulted on 10/09/19. It is unsure if an infectious process is contributing to the rash that she has. CXR, UA, Blood cultures and Lactic Acid were ordered as a part of infectious work up.  - 10/09/19 CXR w/no infectious process such as  pneumonia, no acute findings  - 8/11/211 Blood cultures w/no growth so far  - UA pending  - Lactic acid elevated at 2.9/2.3   HEME  H/H/P are stable. SCD's for DVT Prophylaxis  - Daily CBC   SKIN #Diffuse Rash w/White Papules After being admitted, she developed a diffuse rash to her chest, bilateral arms and bilateral legs. The rash is itchy at times. She was given Benadryl 50 mg PO X1 on 10/08/19 that offered her some relief. The cause of the rash is unknown. As a precaution, her Plavix was discontinued on  10/09/19 given that it was a newly started medication that she was not previously on. The patient states that this has happened in the past while hospitalized related to her bed sheets.   Of note, at her stroke follow up visit 11/20/14, it is documented that she had a UTI that admission, was on Ceftriaxone which made her entire body read and itchy. The redness improved but at her follow up visit on 11/20/14 she still had red bumps on her chest back and arms that itched.   Considering a reaction to an unknown source versus an infectious process given leukocytosis and fever. Will proceed with punch biopsy for further evaluation. Will speak to surgery regarding this today on 10/10/19 to see if it can be performed by them.   Other Stroke Risk Factors Her other stroke risk factors include advanced age.   Other Active Problems:  #Depression She has a history of depression and her home medications Celexa 40 mg and Trazadone 50 mg were resumed for this.  DIspo: Currently recommended for Home PT/OT/ST by therapies   Hospital day # 3  Stark Jock, NP [facility] Nurse Practitioner  Patient seen and discussed with attending physician Dr. Roda Shutters  10/10/2019 11:00 AM  ATTENDING NOTE: I reviewed above note and agree with the assessment and plan. Pt was seen and examined.   Pt niece is at bedside. Pt lying in bed, facial rash improved but still has truncal and proximal UEs and LEs rash. Internal medicine feels more like contact dermatitis with eczema with reactive leukocytosis. Put on oral benadryl and local steroid cream. Skin biopsy cancelled. Cardiology on board, recommend eliquis.  On exam, pt awake alert, orientated and in good spirit, no complains. No focal neuro deficit. Vitals stable but still has low grade fever 100.2 and leukocytosis. Will continue to trend CBC, and close monitoring. PT/OT recommend HH.   Marvel Plan, MD PhD Stroke Neurology 10/10/2019 11:16 PM   To contact Stroke  Continuity provider, please refer to [email or web address]. After hours, contact General Neurology

## 2019-10-11 LAB — CBC
HCT: 38.1 % (ref 36.0–46.0)
Hemoglobin: 12.6 g/dL (ref 12.0–15.0)
MCH: 29.7 pg (ref 26.0–34.0)
MCHC: 33.1 g/dL (ref 30.0–36.0)
MCV: 89.9 fL (ref 80.0–100.0)
Platelets: 306 10*3/uL (ref 150–400)
RBC: 4.24 MIL/uL (ref 3.87–5.11)
RDW: 14.6 % (ref 11.5–15.5)
WBC: 21.5 10*3/uL — ABNORMAL HIGH (ref 4.0–10.5)
nRBC: 0 % (ref 0.0–0.2)

## 2019-10-11 LAB — BASIC METABOLIC PANEL
Anion gap: 6 (ref 5–15)
BUN: 17 mg/dL (ref 8–23)
CO2: 25 mmol/L (ref 22–32)
Calcium: 9 mg/dL (ref 8.9–10.3)
Chloride: 106 mmol/L (ref 98–111)
Creatinine, Ser: 0.95 mg/dL (ref 0.44–1.00)
GFR calc Af Amer: >60 mL/min (ref >60)
GFR calc non Af Amer: 59 mL/min — ABNORMAL LOW (ref >60)
Glucose, Bld: 115 mg/dL — ABNORMAL HIGH (ref 70–99)
Potassium: 4.8 mmol/L (ref 3.5–5.1)
Sodium: 137 mmol/L (ref 135–145)

## 2019-10-11 LAB — MAGNESIUM: Magnesium: 1.5 mg/dL — ABNORMAL LOW (ref 1.7–2.4)

## 2019-10-11 MED ORDER — MAGNESIUM OXIDE 400 (241.3 MG) MG PO TABS
400.0000 mg | ORAL_TABLET | Freq: Once | ORAL | Status: AC
  Administered 2019-10-11: 400 mg via ORAL
  Filled 2019-10-11: qty 1

## 2019-10-11 MED ORDER — MAGNESIUM SULFATE 2 GM/50ML IV SOLN
2.0000 g | Freq: Once | INTRAVENOUS | Status: DC
  Filled 2019-10-11: qty 50

## 2019-10-11 NOTE — Progress Notes (Signed)
Physical Therapy Treatment Patient Details Name: Alyssa Small MRN: [id number] DOB: [birth year] Today's Date: 10/11/2019    History of Present Illness Pt is a 74 y/o female with PMH of CVA, HTN presenting to ED with facial droop.  In ED initally resolved but noted L sided symptoms again in triage, which resolved again by CT. CT negative, old L cerebellar infarct. CTA reveals M2 MCA branch proximal occlusion. MRI reveals scattered patchy inarction in the R MCA, most notable along the deep insular cortex, right lateral temporal cortex, and right parietal deep and subcortical white matter.    PT Comments    Patient received in bed, very pleasant, talkative, motivated. Agrees to PT session. Patient performed bed mobility with mod independence. Transfers with min guard, ambulated 150 feet with min guard using rw. No LOB. Fatigues with mobility. Performed bed level exercises to B LE. She will continue to benefit from skilled PT while here to improve functional independence and safety with mobility.      Follow Up Recommendations  Home health PT;Supervision/Assistance - 24 hour     Equipment Recommendations  None recommended by PT    Recommendations for Other Services       Precautions / Restrictions Precautions Precautions: Fall Precaution Comments: red/inflamation/rash t/o body Restrictions Weight Bearing Restrictions: No    Mobility  Bed Mobility Overal bed mobility: Modified Independent Bed Mobility: Supine to Sit;Sit to Supine Rolling: Modified independent (Device/Increase time) Sidelying to sit: Modified independent (Device/Increase time) Supine to sit: Modified independent (Device/Increase time) Sit to supine: Modified independent (Device/Increase time)   General bed mobility comments: + rail  Transfers Overall transfer level: Needs assistance Equipment used: Rolling walker (2 wheeled) Transfers: Sit to/from Stand Sit to Stand: Min guard         General transfer  comment: min guard for safety  Ambulation/Gait Ambulation/Gait assistance: Min guard Gait Distance (Feet): 150 Feet Assistive device: Rolling walker (2 wheeled) Gait Pattern/deviations: Step-through pattern;Decreased stride length Gait velocity: decreased   General Gait Details: fatigues quickly. No LOB noted.   Stairs             Wheelchair Mobility    Modified Rankin (Stroke Patients Only) Modified Rankin (Stroke Patients Only) Pre-Morbid Rankin Score: Moderate disability Modified Rankin: Moderate disability     Balance Overall balance assessment: Modified Independent Sitting-balance support: Feet supported Sitting balance-Leahy Scale: Good     Standing balance support: Bilateral upper extremity supported;During functional activity Standing balance-Leahy Scale: Good Standing balance comment: relies on BUE support dynamically                            Cognition Arousal/Alertness: Awake/alert Behavior During Therapy: WFL for tasks assessed/performed Overall Cognitive Status: Within Functional Limits for tasks assessed                         Following Commands: Follows one step commands consistently       General Comments: Patient very talkative. Hopeful to go home today possibly.      Exercises Other Exercises Other Exercises: B LE exercises: ap, heel slides, SLR, hip abd/add    General Comments        Pertinent Vitals/Pain Pain Assessment: Faces Faces Pain Scale: Hurts a little bit Pain Location: L hip Pain Descriptors / Indicators: Discomfort Pain Intervention(s): Limited activity within patient's tolerance;Monitored during session    Home Living  Prior Function            PT Goals (current goals can now be found in the care plan section) Acute Rehab PT Goals Patient Stated Goal: to get home  PT Goal Formulation: With patient Time For Goal Achievement: 10/22/19 Potential to Achieve  Goals: Good Progress towards PT goals: Progressing toward goals    Frequency    Min 4X/week      PT Plan Current plan remains appropriate    Co-evaluation              AM-PAC PT "6 Clicks" Mobility   Outcome Measure  Help needed turning from your back to your side while in a flat bed without using bedrails?: None Help needed moving from lying on your back to sitting on the side of a flat bed without using bedrails?: None Help needed moving to and from a bed to a chair (including a wheelchair)?: A Little Help needed standing up from a chair using your arms (e.g., wheelchair or bedside chair)?: A Little Help needed to walk in hospital room?: A Little Help needed climbing 3-5 steps with a railing? : A Little 6 Click Score: 20    End of Session Equipment Utilized During Treatment: Gait belt Activity Tolerance: Patient tolerated treatment well Patient left: in bed;with call bell/phone within reach Nurse Communication: Mobility status PT Visit Diagnosis: Unsteadiness on feet (R26.81);Muscle weakness (generalized) (M62.81)     Time: 2585-2778 PT Time Calculation (min) (ACUTE ONLY): 23 min  Charges:  $Gait Training: 8-22 mins $Therapeutic Exercise: 8-22 mins                     Siddh Vandeventer, PT, GCS 10/11/19,2:51 PM

## 2019-10-11 NOTE — Progress Notes (Signed)
VAST consulted to obtain IV access. Pt lost 2 IV's this am. Only IV med currently ordered is IV Magnesium. Spoke with pt who was told by one physician that she will be discharged this afternoon. Contacted Dr. Ella Jubilee via SecureChat to inquire if IV needed at this time. He responded no need for PIV, he will change magnesium dose to PO route. Notified pt's RN who verbalized understanding.

## 2019-10-11 NOTE — Plan of Care (Signed)
  Problem: Coping: Goal: Level of anxiety will decrease Outcome: Progressing   Problem: Pain Managment: Goal: General experience of comfort will improve Outcome: Progressing   Problem: Safety: Goal: Ability to remain free from injury will improve Outcome: Progressing   Problem: Education: Goal: Knowledge of disease or condition will improve Outcome: Progressing Goal: Knowledge of secondary prevention will improve Outcome: Progressing Goal: Knowledge of patient specific risk factors addressed and post discharge goals established will improve Outcome: Progressing Goal: Individualized Educational Video(s) Outcome: Progressing   Problem: Ischemic Stroke/TIA Tissue Perfusion: Goal: Complications of ischemic stroke/TIA will be minimized Outcome: Progressing

## 2019-10-11 NOTE — Progress Notes (Addendum)
PROGRESS NOTE    Alyssa Small  WIO:[id number] DOB: Jan 02, 1946 DOA: 10/07/2019 PCP: Patient, No Pcp Per    Brief Narrative:  Reason for consultation: Low-grade fever, leukocytosis and rash.  74 year old female with past medical history of hypertension, CVA, arthritis and GERD who was admitted to the neurology service on 8/9 with a working diagnosis of acute cardioembolic right MCA CVA.  Patient was noted to have an acute left facial droop and was brought to the hospital by her family.  On her initial physical examination she had left-sided neglect, her blood pressure was 163/74, pulse rate 76, temperature 98.8, respirate 19, oxygen saturation 98%.  Further work-up with brain MRI showed small scattered infarcts along the right MCA territory. While on telemetry monitoring on 8/10 she developed atrial fibrillation with rapid ventricular response.  Her heart rate was controlled with AV blockade with metoprolol, and she was placed on full anticoagulation with apixaban.  On August 11 patient had a temperature of 100.3 F and noted to have a small left-sided ulceration at the lower lip on the left  Patient has been placed on benadryl and topical steroids with improvement of her rash.   Assessment & Plan:   Active Problems:   Stroke (cerebrum) (HCC)   Atrial fibrillation with RVR (HCC)   Contact dermatitis and eczema   1. Contact dermatitis with eczema with reactive leukocytosis. Rash and pruritus clinically improving. Wbc is trending down to 21.5, urine analysis with > than 50 cells, but no urinary symptoms, blood cultures continue with no growth. Patient continue to be afebrile.   Now she is using her own gown and bed sheets.   Continue benadryl scheduled 25 mg tid for a total of 3 days and continue topical hydrocortisone 0,5% for 7days. No clinical signs of bacterial or urinary tract infection, continue to hold on antibiotic therapy.   2. Hypokalemia/ hypomagnesemia. Serum K up to 4,8,  with Mg at 1,5. Preserved renal function with serum cr at 0,95, patient is tolerating po well.   Will add 2g Mag sulfate x1.  .  3. Acute cardio-embolic right CVA/ right MCA territory. Improving neurologic deficits. Echocardiogram with preserved LV systolic function.   Continue anticoagulation with apixaban and statin therapy with simvastatin 40 mg daily.   Plan for home health services.   4. New onset atrial fibrillation. Converted to sinus rhythm. Consolidate rate control with metoprolol 50 mg po bid.   Antithrombotic therapy with apixaban,   5, Hypothyroid. On levothyroxine.   6. Depression. On citalopram.    From medical perspective patient is stable for discharge.   DVT prophylaxis: apixaban   Code Status:   full  Family Communication:  No family at the bedside      Subjective: Patient is feeling better, rash and pruritus is improving, no dyspnea, no chest pain, no nausea or vomiting. No abdominal pain, no dysuria or increased urinary frequency.   Objective: Vitals:   10/10/19 2012 10/11/19 0025 10/11/19 0427 10/11/19 0744  BP: (!) 110/45 (!) 105/43 (!) 131/55   Pulse: 82 79 88   Resp: 18 (!) 25 (!) 22   Temp: 99.5 F (37.5 C) 98.1 F (36.7 C) 98 F (36.7 C)   TempSrc: Oral Oral Oral   SpO2: 98% 96% 95% 100%    Intake/Output Summary (Last 24 hours) at 10/11/2019 0816 Last data filed at 10/11/2019 0804 Gross per 24 hour  Intake --  Output 1 ml  Net -1 ml   There were no vitals filed  for this visit.  Examination:   General: Not in pain or dyspnea  Neurology: Awake and alert, non focal.   E ENT: no pallor, no icterus, oral mucosa moist Cardiovascular: No JVD. S1-S2 present, rhythmic, no gallops, rubs, or murmurs. No lower extremity edema. Pulmonary: positive breath sounds bilaterally. Gastrointestinal. Abdomen soft and non tender Skin. Thorax with resolved rash, residual erythema left inner proximal thigh and right lateral arm.  Musculoskeletal: no  joint deformities     Data Reviewed: I have personally reviewed following labs and imaging studies  CBC: Recent Labs  Lab 10/07/19 1301 10/07/19 1301 10/07/19 1309 10/09/19 0441 10/09/19 1222 10/10/19 0145 10/11/19 0159  WBC 7.0  --   --  24.3* 27.5* 26.4* 21.5*  NEUTROABS 4.2  --   --   --  24.7*  --   --   HGB 13.6   < > 14.3 14.5 14.1 13.1 12.6  HCT 41.2   < > 42.0 43.0 42.5 38.4 38.1  MCV 90.9  --   --  89.8 89.9 88.9 89.9  PLT 340  --   --  333 342 290 306   < > = values in this interval not displayed.   Basic Metabolic Panel: Recent Labs  Lab 10/07/19 1301 10/07/19 1309 10/09/19 0441 10/10/19 0145 10/11/19 0159  NA 140 142 139 137 137  K 4.0 3.5 4.1 3.1* 4.8  CL 104 103 102 103 106  CO2 25  --  25 23 25   GLUCOSE 107* 106* 125* 121* 115*  BUN 9 10 12 19 17   CREATININE 0.94 0.90 0.93 1.18* 0.95  CALCIUM 9.4  --  9.1 8.8* 9.0  MG  --   --   --   --  1.5*   GFR: CrCl cannot be calculated (Unknown ideal weight.). Liver Function Tests: Recent Labs  Lab 10/07/19 1301  AST 25  ALT 12  ALKPHOS 80  BILITOT 0.9  PROT 7.1  ALBUMIN 3.6   No results for input(s): LIPASE, AMYLASE in the last 168 hours. No results for input(s): AMMONIA in the last 168 hours. Coagulation Profile: Recent Labs  Lab 10/07/19 1301  INR 1.0   Cardiac Enzymes: No results for input(s): CKTOTAL, CKMB, CKMBINDEX, TROPONINI in the last 168 hours. BNP (last 3 results) No results for input(s): PROBNP in the last 8760 hours. HbA1C: No results for input(s): HGBA1C in the last 72 hours. CBG: Recent Labs  Lab 10/07/19 1628  GLUCAP 100*   Lipid Profile: No results for input(s): CHOL, HDL, LDLCALC, TRIG, CHOLHDL, LDLDIRECT in the last 72 hours. Thyroid Function Tests: Recent Labs    10/09/19 0441  TSH 1.729   Anemia Panel: No results for input(s): VITAMINB12, FOLATE, FERRITIN, TIBC, IRON, RETICCTPCT in the last 72 hours.    Radiology Studies: I have reviewed all of the  imaging during this hospital visit personally     Scheduled Meds: .  stroke: mapping our early stages of recovery book   Does not apply Once  . apixaban  5 mg Oral BID  . citalopram  20 mg Oral Daily  . diphenhydrAMINE  25 mg Oral TID PC  . hydrocortisone cream   Topical TID  . levothyroxine  75 mcg Oral q morning - 10a  . metoprolol tartrate  50 mg Oral BID  . pantoprazole  40 mg Oral Daily  . simvastatin  40 mg Oral QHS  . traZODone  50 mg Oral QHS   Continuous Infusions:   LOS: 4 days  Alyssa Ernandez Gerome Apley, MD

## 2019-10-11 NOTE — Progress Notes (Addendum)
Progress Note  Patient Name: Alyssa Small Date of Encounter: 10/11/2019  Primary Cardiologist: Rollene Rotunda, MD  Subjective   Feeling good, no CP or SOB. Rash had improved but then she had to have her gown replaced due to a BM and the rash recurred on her arms and chest -> someone is bringing clothing from home to replace.  Inpatient Medications    Scheduled Meds: .  stroke: mapping our early stages of recovery book   Does not apply Once  . apixaban  5 mg Oral BID  . citalopram  20 mg Oral Daily  . diphenhydrAMINE  25 mg Oral TID PC  . hydrocortisone cream   Topical TID  . levothyroxine  75 mcg Oral q morning - 10a  . metoprolol tartrate  50 mg Oral BID  . pantoprazole  40 mg Oral Daily  . simvastatin  40 mg Oral QHS  . traZODone  50 mg Oral QHS   Continuous Infusions: . magnesium sulfate bolus IVPB     PRN Meds: acetaminophen **OR** acetaminophen (TYLENOL) oral liquid 160 mg/5 mL **OR** acetaminophen, albuterol, fluticasone, senna-docusate   Vital Signs    Vitals:   10/11/19 0427 10/11/19 0744 10/11/19 0806 10/11/19 1123  BP: (!) 131/55  (!) (P) 119/48 (!) (P) 115/45  Pulse: 88  (P) 86 (P) 62  Resp: (!) 22  (P) 20 (!) (P) 22  Temp: 98 F (36.7 C)  (P) 98.7 F (37.1 C) (P) 98.5 F (36.9 C)  TempSrc: Oral  (P) Oral (P) Oral  SpO2: 95% 100% (P) 98% (P) 100%    Intake/Output Summary (Last 24 hours) at 10/11/2019 1200 Last data filed at 10/11/2019 0804 Gross per 24 hour  Intake --  Output 1 ml  Net -1 ml   No flowsheet data found.   Telemetry    NSR, briefly had occasional PVCs/bigeminy - Personally Reviewed  Physical Exam   GEN: No acute distress.  HEENT: Normocephalic, atraumatic, sclera non-icteric. Neck: No JVD or bruits. Cardiac: RRR no murmurs, rubs, or gallops.  Radials/DP/PT 1+ and equal bilaterally.  Respiratory: Clear to auscultation bilaterally. Breathing is unlabored. GI: Soft, nontender, non-distended, BS +x 4. MS: no deformity.  Erythematous confluence on her upper extremities and upper chest Extremities: No clubbing or cyanosis. No edema. Distal pedal pulses are 2+ and equal bilaterally. Neuro:  AAOx3. Follows commands. Psych:  Responds to questions appropriately with a normal affect.  Labs    High Sensitivity Troponin:  No results for input(s): TROPONINIHS in the last 720 hours.    Cardiac EnzymesNo results for input(s): TROPONINI in the last 168 hours. No results for input(s): TROPIPOC in the last 168 hours.   Chemistry Recent Labs  Lab 10/07/19 1301 10/07/19 1309 10/09/19 0441 10/10/19 0145 10/11/19 0159  NA 140   < > 139 137 137  K 4.0   < > 4.1 3.1* 4.8  CL 104   < > 102 103 106  CO2 25   < > 25 23 25   GLUCOSE 107*   < > 125* 121* 115*  BUN 9   < > 12 19 17   CREATININE 0.94   < > 0.93 1.18* 0.95  CALCIUM 9.4   < > 9.1 8.8* 9.0  PROT 7.1  --   --   --   --   ALBUMIN 3.6  --   --   --   --   AST 25  --   --   --   --  ALT 12  --   --   --   --   ALKPHOS 80  --   --   --   --   BILITOT 0.9  --   --   --   --   GFRNONAA 59*   < > >60 45* 59*  GFRAA >60   < > >60 52* >60  ANIONGAP 11   < > 12 11 6    < > = values in this interval not displayed.     Hematology Recent Labs  Lab 10/09/19 1222 10/10/19 0145 10/11/19 0159  WBC 27.5* 26.4* 21.5*  RBC 4.73 4.32 4.24  HGB 14.1 13.1 12.6  HCT 42.5 38.4 38.1  MCV 89.9 88.9 89.9  MCH 29.8 30.3 29.7  MCHC 33.2 34.1 33.1  RDW 14.5 14.4 14.6  PLT 342 290 306    BNPNo results for input(s): BNP, PROBNP in the last 168 hours.   DDimer No results for input(s): DDIMER in the last 168 hours.   Radiology    No results found.  Cardiac Studies   2D echo 10/08/19 1. Left ventricular ejection fraction, by estimation, is 60 to 65%. The  left ventricle has normal function. The left ventricle has no regional  wall motion abnormalities. Left ventricular diastolic parameters are  consistent with Grade II diastolic  dysfunction (pseudonormalization).  Elevated left atrial pressure.  2. Right ventricular systolic function is normal. The right ventricular  size is normal.  3. The mitral valve is normal in structure. No evidence of mitral valve  regurgitation. No evidence of mitral stenosis.  4. The aortic valve is normal in structure. Aortic valve regurgitation is  not visualized. No aortic stenosis is present.  5. The inferior vena cava is normal in size with greater than 50%  respiratory variability, suggesting right atrial pressure of 3 mmHg.   Patient Profile     74 y.o. female with history of HTN, arthritis, CVA, hypothyroidism, depression, and GERD presented to ED with left sided facial droop and weakness, found to have new acute R MCA stroke. She was also found to be in atrial fib RVR. On hospital day 2 patient was noted to breakout in a rash going from her head down to her legs.  Patient had been given contrast media for cerebral CT,  IV fluids of normal saline at 75 mL/h overnight, and had received chlorhexidine gluconate pads for washing.  She was given Benadryl for her symptoms without improvement.  The only new medication that appears to have been started was Plavix yesterday morning, but possibly was given after symptoms had already started. Patient later thought rash may be 2/2 detergent that had happened before. Also noted to have low grade fever, leukocytosis. Cardiology following for atrial fib.  Assessment & Plan    1. Recurrent stroke, likely cardioembolic - found to have otherwise asymptomatic afib this admission, now on Eliquis - LVEF normal - statin therapy managed by primary team  2. Newly recognized atrial fibrillation - spontaneously converted to NSR - now on Eliquis for CHADSVASC 6 - continue metoprolol - TSH wnl  3. Essential HTN - controlled  4. Occasional PVCs, asymptomatic - primary team repleting lytes -> aim for goal K 4.0 or greater (normal today) and Mg 2.0 or greater - normal EF - continue  BB  5. Rash - per primary team  Have arranged f/u in our afib clinic 10/24/19, appt will be placed on AVS.  For questions or updates, please contact [facility]  Please consult [email or web address] for contact info under Cardiology/STEMI.  Signed, Laurann Montana, PA-C 10/11/2019, 12:00 PM    History and all data above reviewed.  Patient examined.  I agree with the findings as above.  No chest pain.  No SOB.  The patient exam reveals COR:RRR  ,  Lungs: Clear  ,  Abd: Positive bowel sounds, no rebound no guarding, Ext No edema,  Skin:  Rash on arms and chest  .  All available labs, radiology testing, previous records reviewed. Agree with documented assessment and plan. Atrial fib:  Maintaining NSR.  Now on Eliquis.  No further change in therapy.  Follow up as above.  Please call with further questions.    Alyssa Small  1:40 PM  10/11/2019

## 2019-10-11 NOTE — Progress Notes (Addendum)
STROKE TEAM PROGRESS NOTE   Subjective/Interval History: Alyssa Small is sitting up in bed in good spirits this morning. States that Alyssa Small niece brought Alyssa Small sheets from home and that Alyssa Small rash feels better with the use of scheduled hydrocortisone cream + benadryl. Alyssa Small brings up Alyssa Small son and how he has traveled to [place].   Objective: Temp:  [98 F (36.7 C)-99.5 F (37.5 C)] (P) 98.5 F (36.9 C) (08/13 1123) Pulse Rate:  [62-88] (P) 62 (08/13 1123) Cardiac Rhythm: Normal sinus rhythm (08/13 0802) Resp:  [18-25] (P) 22 (08/13 1123) BP: (105-131)/(43-55) (P) 115/45 (08/13 1123) SpO2:  [95 %-100 %] (P) 100 % (08/13 1123)  Stroke Lab Work:  10/08/19 Lipid Panel: Total cholesterol 171, Triglycerides 43, HDL 81, LDL 81 10/08/19 Hemoglobin G9F: 5.5   Pertinent Imaging:   I have personally reviewed the radiological images below and agree with the radiology interpretations.  10/07/19 CT Head No acute intracranial findings. Old left cerebellar stroke.   10/07/19 CT Perfusion  Occlusion of a proximal right M2 branch with some reconstitution more distally.Perfusion imaging demonstrates no evidence of core infarction. However, there is 33 mL of penumbra identified in the posterior right MCA territory. No measurable stenosis in the neck.1 mm outpouching of the distal supraclinoid left ICA likely reflecting an infundibulum. Aspects score is 10.   10/07/19 CT Angio Head and Neck  Neck findings: Great vessel origins are patent. Right carotid system is Alyssa Small. Partially retropharyngeal course of the ICA. No measurable stenosis. Left carotid system is patent with no measurable stenosis.   Head findings: Anterior circulation: Intracranial internal carotid arteries are patent with mild calcified plaque. There is a 1 mm inferiorly directed outpouching of the distal supraclinoid left ICA. Appears to be a diminutive branch arising from this area. Right M1 MCA is patent. There is occlusion of a proximal right M2 MCA branch  with some reconstitution more distally. Left middle and both anterior cerebral arteries are patent. An anterior communicating artery is present.  Posterior circulation: Intracranial vertebral arteries, basilar artery, and posterior cerebral arteries are patent.   10/07/19 MRI Brain WO Contrast Few small foci of scattered patchy infarction in the right MCA territory as noted above, most notable along the deep insular cortex, right lateral temporal cortex, and right parietal deep and subcortical white matter. No large confluent acute infarction. No mass effect or evidence of parenchymal hemorrhage. One can appreciate signal loss associated with the occluded right MCA branch in the insula on the susceptibility weighted imaging.Old left cerebellar infarction. Chronic small-vessel ischemic changes of the cerebral hemispheric white matter.  10/07/19 CXR 2 View Normal sized heart. Clear lungs with normal vascularity. Thoracic spine degenerative changes. Diffuse osteopenia.  10/08/19 Echocardiogram Complete  Ejection Fraction 60-65 %, left ventricle has normal function, right ventricular systolic function is normal, left atrium normal in size, right atrium normal in size, no evidence of pericardial effusion, no mitral tricuspid or aortic valvular regurgitation noted  10/09/19 CXR 2 View  Trachea is midline. Heart size within normal limits. Streaky atelectasis or scarring in the left lower lobe. Lungs are otherwise clear. No pleural fluid. Extensive osteophytosis in the thoracic spine.  Physical Examination: General: Well nourished, well developed, in no apparent distress Skin: Redness noted to chest, bilateral arms and bilateral legs, white papules noted to face are gone   Neurological Exam:  Mental status: AAOx4, follows commands  Speech: Fluent with repetition and naming intact, no dysarthria  Cranial nerves: EOMI, VFF, Face symmetric, shoulder shrug intact  Motor: Strength 5/5 throughout, no drift noted  to any extremity  Coordination: No ataxia w/FNF or HTS  Sensation: Intact to light touch throughout  Gait: Deferred  NIHSS: 0   Assessment and Plan: Alyssa Small is a 74 y.o. female w/pmh of L PICA Stroke(10/29/14), HTN, HLD, UTI, Depression, Arthritis, Hypothyroidism who presents with a R MCA Stroke that is cardioembolic in the setting of newly diagnosed atrial fibrillation that was found this hospital admission. Alyssa Small CTA Head and Neck upon admission was notable for a right M2 branch occlusion. Alyssa Small was not a candidate for IVTPA as Alyssa Small was outside of the time window to receive it. Alyssa Small NIHSS was 1 on admission and given low NIHSS, after discussion with interventional radiology, the decision was made to defer mechanical thrombectomy.  NEURO #R MCA Stroke in the setting of right M2 occlusion, cardioembolic in the setting of newly found atrial fibrillation  Alyssa Small stroke work is complete at this time. Alyssa Small MRI revealed right MCA patchy small infarcts. Echocardiogram showed relatively normal heart function and was negative for intracardiac source of stroke. Stroke labs were done including lipid panel w/LDL 81, hemoglobin A1C 5.5 Alyssa Small was originally placed on DAPT with the plan to transition to Eliquis however due to concern for infection/diffuse rash, Plavix was discontinued on 10/09/19 and initiating Eliquis was postponed. On 10/11/19, with the rash confirmed as a contact dermatitis and infectious work up negative so far, Eliquis 5 mg BID was initiated. Aspirin was discontinued on 10/10/19 in anticipation of starting Eliquis.  - Eliquis 5 mg BID for secondary stroke prevention - Continue Zocor 40 mg for secondary stroke prevention  #Depression Alyssa Small has a history of depression and Alyssa Small home medications Celexa 40 mg and Trazadone 50 mg were resumed for this.  CARDS #Hypertension #Atrial Fibrillation  At home Alyssa Small takes Metoprolol XL 25 mg for blood pressure control. Currently Alyssa Small blood pressure is running  normotensive and Alyssa Small was started on Metoprolol 25 Q6 by cardiology for rate control. This was transitioned to Metoprolol 50 mg BID by Cardiology on 10/10/19. Will continue to trend blood pressure and Alyssa Small rate, will defer to Cardiology for metoprolol adjustments. - Metoprolol 50 mg BID   #Hyperlipidemia Alyssa Small takes Zocor 20 mg at home for HLD, this was increased to 40 mg this admission for better cholesterol control. Alyssa Small LDL is 81, goal LDL from a stroke prevention standpoint is < 70. Alyssa Small is to continue Zocor 40 mg at discharge.   RENAL #Hypokalemia  Overall chemistries have been stable on 10/10/19 Alyssa Small was noted to have low potassium at 3.1 This was supplemented and as of 10/11/19 Alyssa Small potassium is 4.8  - Continue to trend BMP   ENDO #Hypothyroidism   Continue with home Levothyroxine 75 mcg daily.   GI #Acute dysphagia  Passed swallow evaluation with speech on 10/08/19. They recommended a NDD3 diet with thin liquids given dysphagia   ID  #Leukocytosis  Alyssa Small WBC count jumped from 7.0 ( noted on admission CBC 10/07/19) to 24.3 as of 10/09/19. Subsequently it was noted to be 27.5, 26.4 and today on 10/11/19 21.5. It appears to be down trending. Alyssa Small fevered on 8/10 + 8/12 with temperature noted to be 100.3 and 100.2 respectively. No low grade fever as of 10/11/19. Infectious work up was initiated and the hospitalist team was consulted on 10/09/19. CXR, UA, Blood cultures and Lactic Acid were ordered as a part of infectious work up. Per the hospitalist team, the leukocytosis could be reactive in  the setting of Alyssa Small contact dermatitis. There are no clinical signs of a bacterial or urinary tract infection and it is recommended to continue to hold antibiotic therapy.  - 10/09/19 CXR w/no infectious process such as pneumonia, no acute findings  - 8/11/211 Blood cultures w/no growth so far  - 10/10/19 UA negative for UTI  - Lactic acid elevated at 2.9/2.3  - Continue to trend temperature and CBC   HEME  H/H/P are  stable. SCD's + Eliquis for DVT Prophylaxis  - Daily CBC   SKIN #Contact Dermatitis w/rective leukocytosis After being admitted, Alyssa Small developed a diffuse rash to Alyssa Small chest, bilateral arms and bilateral legs. The rash is itchy at times. Alyssa Small was given Benadryl 50 mg PO X1 on 10/08/19 that offered Alyssa Small some relief. The cause of the rash is unknown. As a precaution, Alyssa Small Plavix was discontinued on 10/09/19 given that it was a newly started medication that Alyssa Small was not previously on. The Alyssa Small states that this has happened in the past while hospitalized related to Alyssa Small bed sheets.   Alyssa Small was seen by the medicine team and ultimately it was felt that Alyssa Small has contact dermatitis with reactive leukocytosis. Alyssa Small is now on scheduled Benadryl and Hydrocortisone cream. Sheets have been brought in by Alyssa Small family. Per hospitalist team, "continue benadryl scheduled 25 mg tid for a total of 3 days and continue topical hydrocortisone 0,5% for 7days. No clinical signs of bacterial or urinary tract infection, continue to hold on antibiotic therapy."  - Continue benadryl and hydrocortisone as directed above - Continue to f/u hospitalist recs   #Other Stroke Risk Factors Alyssa Small other stroke risk factors include advanced age.    #DISPO:  Currently recommended for Home Alyssa Small/OT/ST by therapies   Hospital day # 4  Stark Jock, NP [facility] Nurse Practitioner  Alyssa Small seen and discussed with attending physician Dr. Roda Shutters  10/11/2019 11:48 AM  ATTENDING NOTE: I reviewed above note and agree with the assessment and plan. Alyssa Small was seen and examined.   Alyssa Small lying in bed, no family at bedside.  Body rash much improved, but still prominent in extremities, much improved in the face and trunk area.  Alyssa Small is on Eliquis from today and continue metoprolol for heart rate control.  Still has leukocytosis but trending down.  I called his niece for update.  We hope Alyssa Small will tolerating Eliquis and Alyssa Small leukocytosis and rash  continue improving, and we can likely discharge Alyssa Small home with home Alyssa Small OT over the weekend.  Marvel Plan, MD PhD Stroke Neurology 10/11/2019 11:48 AM   To contact Stroke Continuity provider, please refer to [email or web address]. After hours, contact General Neurology

## 2019-10-12 DIAGNOSIS — I639 Cerebral infarction, unspecified: Secondary | ICD-10-CM | POA: Diagnosis present

## 2019-10-12 LAB — CBC
HCT: 37.5 % (ref 36.0–46.0)
Hemoglobin: 12.5 g/dL (ref 12.0–15.0)
MCH: 29.8 pg (ref 26.0–34.0)
MCHC: 33.3 g/dL (ref 30.0–36.0)
MCV: 89.5 fL (ref 80.0–100.0)
Platelets: 313 10*3/uL (ref 150–400)
RBC: 4.19 MIL/uL (ref 3.87–5.11)
RDW: 14.6 % (ref 11.5–15.5)
WBC: 13 10*3/uL — ABNORMAL HIGH (ref 4.0–10.5)
nRBC: 0 % (ref 0.0–0.2)

## 2019-10-12 LAB — BASIC METABOLIC PANEL
Anion gap: 8 (ref 5–15)
BUN: 11 mg/dL (ref 8–23)
CO2: 26 mmol/L (ref 22–32)
Calcium: 8.8 mg/dL — ABNORMAL LOW (ref 8.9–10.3)
Chloride: 106 mmol/L (ref 98–111)
Creatinine, Ser: 0.82 mg/dL (ref 0.44–1.00)
GFR calc Af Amer: >60 mL/min (ref >60)
GFR calc non Af Amer: >60 mL/min (ref >60)
Glucose, Bld: 104 mg/dL — ABNORMAL HIGH (ref 70–99)
Potassium: 3.8 mmol/L (ref 3.5–5.1)
Sodium: 140 mmol/L (ref 135–145)

## 2019-10-12 MED ORDER — SIMVASTATIN 40 MG PO TABS: 40.0000 mg | ORAL_TABLET | Freq: Every day | ORAL | 2 refills | Status: AC

## 2019-10-12 MED ORDER — CITALOPRAM HYDROBROMIDE 20 MG PO TABS: 20.0000 mg | ORAL_TABLET | Freq: Every day | ORAL | 2 refills | Status: AC

## 2019-10-12 MED ORDER — METOPROLOL TARTRATE 50 MG PO TABS: 50.0000 mg | ORAL_TABLET | Freq: Two times a day (BID) | ORAL | 2 refills | Status: AC

## 2019-10-12 MED ORDER — APIXABAN 5 MG PO TABS: 5.0000 mg | ORAL_TABLET | Freq: Two times a day (BID) | ORAL | 2 refills | Status: DC

## 2019-10-12 NOTE — Progress Notes (Signed)
Discharge instructions reviewed with pt and pt's niece; voiced understanding. Personal belongings packed by pt's niece and transported with pt. Pt. transported via wheelchair by staff to private vehicle driven by niece.

## 2019-10-12 NOTE — Discharge Instructions (Signed)
1. Please report any abnormal bleeding to your physician.  2. Home therapies will be arranged - PT, OT and speech. 3. 24 hour per day supervision / assistance has been recommended for safety. 4. Please take all of your medications as prescribed. 5. If you do not have a primary care provider please select one and schedule an appointment.   Ischemic Stroke  An ischemic stroke is the sudden death of brain tissue. Blood carries oxygen to all areas of the body. This type of stroke happens when your blood does not flow to your brain like normal. Your brain cannot get the oxygen it needs. This is an emergency. It must be treated right away. Symptoms of a stroke usually happen all of a sudden. You may notice them when you wake up. They can include:  Weakness or loss of feeling in your face, arm, or leg. This often happens on one side of the body.  Trouble walking.  Trouble moving your arms or legs.  Loss of balance or coordination.  Feeling confused.  Trouble talking or understanding what people are saying.  Slurred speech.  Trouble seeing.  Seeing two of one object (double vision).  Feeling dizzy.  Feeling sick to your stomach (nauseous) and throwing up (vomiting).  A very bad headache for no reason. Get help as soon as any of these problems start. This is important. Some treatments work better if they are given right away. These include:  Aspirin.  Medicines to control blood pressure.  A shot (injection) of medicine to break up the blood clot.  Treatments given in the blood vessel (artery) to take out the clot or break it up. Other treatments may include:  Oxygen.  Fluids given through an IV tube.  Medicines to thin out your blood.  Procedures to help your blood flow better. What increases the risk? Certain things may make you more likely to have a stroke. Some of these are things that you can change, such as:  Being very overweight (obesity).  Smoking.  Taking  birth control pills.  Not being active.  Drinking too much alcohol.  Using drugs. Other risk factors include:  High blood pressure.  High cholesterol.  Diabetes.  Heart disease.  Being [place], [place] [address] [place] [address], Hispanic, or [place].  Being over age 75.  Family history of stroke.  Having had blood clots, stroke, or warning stroke (transient ischemic attack, TIA) in the past.  Sickle cell disease.  Being a woman with a history of high blood pressure in pregnancy (preeclampsia).  Migraine headache.  Sleep apnea.  Having an irregular heartbeat (atrial fibrillation).  Long-term (chronic) diseases that cause soreness and swelling (inflammation).  Disorders that affect how your blood clots. Follow these instructions at home: Medicines  Take over-the-counter and prescription medicines only as told by your doctor.  If you were told to take aspirin or another medicine to thin your blood, take it exactly as told by your doctor. ? Taking too much of the medicine can cause bleeding. ? If you do not take enough, it may not work as well.  Know the side effects of your medicines. If you are taking a blood thinner, make sure you: ? Hold pressure over any cuts for longer than usual. ? Tell your dentist and other doctors that you take this medicine. ? Avoid activities that may cause damage or injury to your body. Eating and drinking  Follow instructions from your doctor about what you cannot eat or drink.  Eat  healthy foods.  If you have trouble with swallowing, do these things to avoid choking: ? Take small bites when eating. ? Eat foods that are soft or pureed. Safety  Follow instructions from your health care team about physical activity.  Use a walker or cane as told by your doctor.  Keep your home safe so you do not fall. This may include: ? Having experts look at your home to make sure it is safe. ? Putting grab bars in the bedroom and  bathroom. ? Using raised toilets. ? Putting a seat in the shower. General instructions  Do not use any tobacco products. ? Examples of these are cigarettes, chewing tobacco, and e-cigarettes. ? If you need help quitting, ask your doctor.  Limit how much alcohol you drink. This means no more than 1 drink a day for nonpregnant women and 2 drinks a day for men. One drink equals 12 oz of beer, 5 oz of wine, or 1 oz of hard liquor.  If you need help to stop using drugs or alcohol, ask your doctor to refer you to a program or specialist.  Stay active. Exercise as told by your doctor.  Keep all follow-up visits as told by your doctor. This is important. Get help right away if:   You have any signs of a stroke. "BE FAST" is an easy way to remember the main warning signs: ? B - Balance. Signs are dizziness, sudden trouble walking, or loss of balance. ? E - Eyes. Signs are trouble seeing or a change in how you see. ? F - Face. Signs are sudden weakness or loss of feeling of the face, or the face or eyelid drooping on one side. ? A - Arms. Signs are weakness or loss of feeling in an arm. This happens suddenly and usually on one side of the body. ? S - Speech. Signs are sudden trouble speaking, slurred speech, or trouble understanding what people say. ? T - Time. Time to call emergency services. Write down what time symptoms started.  You have other signs of a stroke, such as: ? A sudden, very bad headache with no known cause. ? Feeling sick to your stomach (nausea). ? Throwing up (vomiting). ? Jerky movements you cannot control (seizure). These symptoms may be an emergency. Do not wait to see if the symptoms will go away. Get medical help right away. Call your local emergency services (911 in the [place] Do not drive yourself to the hospital. Summary  An ischemic stroke is the sudden death of brain tissue.  Symptoms of a stroke usually happen all of a sudden. You may notice them when you  wake up.  Get help if you have any warning signs of a stroke. This is important. Some treatments work better if they are given right away. This information is not intended to replace advice given to you by your health care provider. Make sure you discuss any questions you have with your health care provider. Document Revised: 07/26/2017 Document Reviewed: 05/13/2015 Elsevier Patient Education  2020 [employer]   Hospital Discharge After a Stroke  Being discharged from the hospital after a stroke can feel overwhelming. Many things may be different, and it is normal to feel scared or anxious. Some stroke survivors may be able to return to their homes, and others may need more specialized care on a temporary or permanent basis. Your stroke care team will work with you to develop a discharge plan that is best for you.  Ask questions if you do not understand something. Invite a friend or family member to participate in discharge planning. Understanding and following your discharge plan can help to prevent another stroke or other problems. Understanding your medicines After a stroke, your health care provider may prescribe one or more types of medicine. It is important to take medicines exactly as told by your health care provider. Serious harm, such as another stroke, can happen if you are unable to take your medicine exactly as prescribed. Make sure you understand:  What medicine to take.  Why you are taking the medicine.  How and when to take it.  If it can be taken with your other medicines and herbal supplements.  Possible side effects.  When to call your health care provider if you have any side effects.  How you will get and pay for your medicines. Medical assistance programs may be able to help you pay for prescription medicines if you cannot afford them. If you are taking an anticoagulant, be sure to take it exactly as told by your health care provider. This type of medicine can  increase the risk of bleeding because it works to prevent blood from clotting. You may need to take certain precautions to prevent bleeding. You should contact your health care provider if you have:  Bleeding or bruising.  A fall or other injury to your head.  Blood in your urine or stool (feces). Planning for home safety  Take steps to prevent falls, such as installing grab bars or using a shower chair. Ask a friend or family member to get needed things in place before you go home if possible. A therapist can come to your home to make recommendations for safety equipment. Ask your health care provider if you would benefit from this service or from home care. Getting needed equipment Ask your health care provider for a list of any medical equipment and supplies you will need at home. These may include items such as:  Walkers.  Canes.  Wheelchairs.  Hand-strengthening devices.  Special eating utensils. Medical equipment can be rented or purchased, depending on your insurance coverage. Check with your insurance company about what is covered. Keeping follow-up visits After a stroke, you will need to follow up regularly with a health care provider. You may also need rehabilitation, which can include physical therapy, occupational therapy, or speech-language therapy. Keeping these appointments is very important to your recovery after a stroke. Be sure to bring your medicine list and discharge papers with you to your appointments. If you need help to keep track of your schedule, use a calendar or appointment reminder. Preventing another stroke Having a stroke puts you at risk for another stroke in the future. Ask your health care provider what actions you can take to lower the risk. These may include:  Increasing how much you exercise.  Making a healthy eating plan.  Quitting smoking.  Managing other health conditions, such as high blood pressure, high cholesterol, or  diabetes.  Limiting alcohol use. Knowing the warning signs of a stroke  Make sure you understand the signs of a stroke. Before you leave the hospital, you will receive information outlining the stroke warning signs. Share these with your friends and family members. "BE FAST" is an easy way to remember the main warning signs of a stroke:  B - Balance. Signs are dizziness, sudden trouble walking, or loss of balance.  E - Eyes. Signs are trouble seeing or a sudden change in vision.  F - Face. Signs are sudden weakness or numbness of the face, or the face or eyelid drooping on one side.  A - Arms. Signs are weakness or numbness in an arm. This happens suddenly and usually on one side of the body.  S - Speech. Signs are sudden trouble speaking, slurred speech, or trouble understanding what people say.  T - Time. Time to call emergency services. Write down what time symptoms started. Other signs of stroke may include:  A sudden, severe headache with no known cause.  Nausea or vomiting.  Seizure. These symptoms may represent a serious problem that is an emergency. Do not wait to see if the symptoms will go away. Get medical help right away. Call your local emergency services (911 in the [place] Do not drive yourself to the hospital. Make note of the time that you had your first symptoms. Your emergency responders or emergency room staff will need to know this information. Summary  Being discharged from the hospital after a stroke can feel overwhelming. It is normal to feel scared or anxious.  Make sure you take medicines exactly as told by your health care provider.  Know the warning signs of a stroke, and get help right way if you have any of these symptoms. "BE FAST" is an easy way to remember the main warning signs of a stroke. This information is not intended to replace advice given to you by your health care provider. Make sure you discuss any questions you have with your health care  provider. Document Revised: 11/07/2018 Document Reviewed: 05/20/2016 Elsevier Patient Education  2020 [employer]. Information on my medicine - ELIQUIS (apixaban)  This medication education was reviewed with me or my healthcare representative as part of my discharge preparation.   Why was Eliquis prescribed for you? Eliquis was prescribed for you to reduce the risk of a blood clot forming that can cause a stroke if you have a medical condition called atrial fibrillation (a type of irregular heartbeat).  What do You need to know about Eliquis ? Take your Eliquis TWICE DAILY - one tablet in the morning and one tablet in the evening with or without food. If you have difficulty swallowing the tablet whole please discuss with your pharmacist how to take the medication safely.  Take Eliquis exactly as prescribed by your doctor and DO NOT stop taking Eliquis without talking to the doctor who prescribed the medication.  Stopping may increase your risk of developing a stroke.  Refill your prescription before you run out.  After discharge, you should have regular check-up appointments with your healthcare provider that is prescribing your Eliquis.  In the future your dose may need to be changed if your kidney function or weight changes by a significant amount or as you get older.  What do you do if you miss a dose? If you miss a dose, take it as soon as you remember on the same day and resume taking twice daily.  Do not take more than one dose of ELIQUIS at the same time to make up a missed dose.  Important Safety Information A possible side effect of Eliquis is bleeding. You should call your healthcare provider right away if you experience any of the following: ? Bleeding from an injury or your nose that does not stop. ? Unusual colored urine (red or dark brown) or unusual colored stools (red or black). ? Unusual bruising for unknown reasons. ? A serious fall or if you hit your  head (even  if there is no bleeding).  Some medicines may interact with Eliquis and might increase your risk of bleeding or clotting while on Eliquis. To help avoid this, consult your healthcare provider or pharmacist prior to using any new prescription or non-prescription medications, including herbals, vitamins, non-steroidal anti-inflammatory drugs (NSAIDs) and supplements.  This website has more information on Eliquis (apixaban): http://www.eliquis.com/eliquis/home

## 2019-10-12 NOTE — Progress Notes (Signed)
Patient's rash and leukocytosis continue to improve.  No clinical signs of urinary tract infection.  Continue to hold on antibiotics.  Recommendations to follow-up with primary care 7 days after discharge.

## 2019-10-12 NOTE — Care Management (Signed)
Notified [facility] that patient will DC today

## 2019-10-12 NOTE — Discharge Summary (Addendum)
Patient ID: Alyssa Small   MRN: [id number]      DOB: May 25, 1945  Date of Admission: 10/07/2019 Date of Discharge: 10/12/2019  Attending Physician:  Marvel Plan, MD, Stroke MD Consultant(s):    Cardiology  - Ralene Ok, MD  ;  Medical - [facility] - Clydie Braun, MD Patient's PCP:  Patient, No Pcp Per  DISCHARGE DIAGNOSIS:  Right MCA Stroke in the setting of right M2 occlusion, cardioembolic in the setting of newly found atrial fibrillation. Newly diagnosed atrial fibrillation with RVR Anticoagulation medication Contact dermatitis Hyperlipidemia Hypokalemia Leukocytosis with fever Hypertension   Active Problems:   Stroke (cerebrum) (HCC)   Atrial fibrillation with RVR (HCC)   Contact dermatitis and eczema   Past Medical History:  Diagnosis Date   Arthritis    GERD (gastroesophageal reflux disease)    Hypertension    Stroke (HCC)    2007 no deficits    History reviewed. No pertinent surgical history.  Family History Family History  Problem Relation Age of Onset   Hypertension Mother    Hypertension Father     Social History  reports that she has never smoked. She has never used smokeless tobacco. She reports that she does not drink alcohol and does not use drugs.  Allergies as of 10/12/2019       Reactions   Atorvastatin Other (See Comments)   Muscle spasom Muscle spasom   Zolpidem Other (See Comments)   confusion confusion   Ceftriaxone Rash   Other reaction(s): Rash Redness  Redness         Medication List     STOP taking these medications    aspirin EC 81 MG tablet   metoprolol succinate 25 MG 24 hr tablet Commonly known as: TOPROL-XL       TAKE these medications    albuterol 108 (90 Base) MCG/ACT inhaler Commonly known as: VENTOLIN HFA Inhale 2 puffs into the lungs every 4 (four) hours as needed for shortness of breath.   apixaban 5 MG Tabs tablet Commonly known as: ELIQUIS Take 1 tablet (5 mg total) by mouth 2  (two) times daily.   citalopram 20 MG tablet Commonly known as: CELEXA Take 1 tablet (20 mg total) by mouth daily. Start taking on: October 13, 2019 What changed:  medication strength how much to take   fluticasone 50 MCG/ACT nasal spray Commonly known as: FLONASE Place 1 spray into both nostrils daily as needed for allergies.   levothyroxine 75 MCG tablet Commonly known as: SYNTHROID Take 75 mcg by mouth every morning.   metoprolol tartrate 50 MG tablet Commonly known as: LOPRESSOR Take 1 tablet (50 mg total) by mouth 2 (two) times daily.   omeprazole 20 MG capsule Commonly known as: PRILOSEC Take 20 mg by mouth daily.   simvastatin 40 MG tablet Commonly known as: ZOCOR Take 1 tablet (40 mg total) by mouth at bedtime. What changed:  medication strength how much to take   traZODone 50 MG tablet Commonly known as: DESYREL Take 50 mg by mouth at bedtime.   Vitamin D (Ergocalciferol) 1.25 MG (50000 UNIT) Caps capsule Commonly known as: DRISDOL Take 50,000 Units by mouth every 14 (fourteen) days.        HOME MEDICATIONS PRIOR TO ADMISSION Medications Prior to Admission  Medication Sig Dispense Refill   albuterol (VENTOLIN HFA) 108 (90 Base) MCG/ACT inhaler Inhale 2 puffs into the lungs every 4 (four) hours as needed for shortness of breath.  aspirin EC 81 MG tablet Take 81 mg by mouth daily. Swallow whole.     citalopram (CELEXA) 40 MG tablet Take 40 mg by mouth daily.     fluticasone (FLONASE) 50 MCG/ACT nasal spray Place 1 spray into both nostrils daily as needed for allergies.     levothyroxine (SYNTHROID) 75 MCG tablet Take 75 mcg by mouth every morning.     metoprolol succinate (TOPROL-XL) 25 MG 24 hr tablet Take 25 mg by mouth daily.     omeprazole (PRILOSEC) 20 MG capsule Take 20 mg by mouth daily.     simvastatin (ZOCOR) 20 MG tablet Take 20 mg by mouth at bedtime.     traZODone (DESYREL) 50 MG tablet Take 50 mg by mouth at bedtime.     Vitamin D,  Ergocalciferol, (DRISDOL) 1.25 MG (50000 UNIT) CAPS capsule Take 50,000 Units by mouth every 14 (fourteen) days.       HOSPITAL MEDICATIONS   stroke: mapping our early stages of recovery book   Does not apply Once   apixaban  5 mg Oral BID   citalopram  20 mg Oral Daily   diphenhydrAMINE  25 mg Oral TID PC   hydrocortisone cream   Topical TID   levothyroxine  75 mcg Oral q morning - 10a   metoprolol tartrate  50 mg Oral BID   pantoprazole  40 mg Oral Daily   simvastatin  40 mg Oral QHS   traZODone  50 mg Oral QHS    LABORATORY STUDIES CBC    Component Value Date/Time   WBC 13.0 (H) 10/12/2019 0259   RBC 4.19 10/12/2019 0259   HGB 12.5 10/12/2019 0259   HCT 37.5 10/12/2019 0259   PLT 313 10/12/2019 0259   MCV 89.5 10/12/2019 0259   MCH 29.8 10/12/2019 0259   MCHC 33.3 10/12/2019 0259   RDW 14.6 10/12/2019 0259   LYMPHSABS 1.3 10/09/2019 1222   MONOABS 0.7 10/09/2019 1222   EOSABS 0.5 10/09/2019 1222   BASOSABS 0.0 10/09/2019 1222   CMP    Component Value Date/Time   NA 140 10/12/2019 0259   K 3.8 10/12/2019 0259   CL 106 10/12/2019 0259   CO2 26 10/12/2019 0259   GLUCOSE 104 (H) 10/12/2019 0259   BUN 11 10/12/2019 0259   CREATININE 0.82 10/12/2019 0259   CALCIUM 8.8 (L) 10/12/2019 0259   PROT 7.1 10/07/2019 1301   ALBUMIN 3.6 10/07/2019 1301   AST 25 10/07/2019 1301   ALT 12 10/07/2019 1301   ALKPHOS 80 10/07/2019 1301   BILITOT 0.9 10/07/2019 1301   GFRNONAA >60 10/12/2019 0259   GFRAA >60 10/12/2019 0259   COAGS Lab Results  Component Value Date   INR 1.0 10/07/2019   Lipid Panel    Component Value Date/Time   CHOL 171 10/08/2019 0433   TRIG 43 10/08/2019 0433   HDL 81 10/08/2019 0433   CHOLHDL 2.1 10/08/2019 0433   VLDL 9 10/08/2019 0433   LDLCALC 81 10/08/2019 0433   HgbA1C  Lab Results  Component Value Date   HGBA1C 5.5 10/08/2019   Urinalysis    Component Value Date/Time   COLORURINE YELLOW 10/10/2019 1525   APPEARANCEUR CLEAR  10/10/2019 1525   LABSPEC 1.017 10/10/2019 1525   PHURINE 6.0 10/10/2019 1525   GLUCOSEU NEGATIVE 10/10/2019 1525   HGBUR NEGATIVE 10/10/2019 1525   BILIRUBINUR NEGATIVE 10/10/2019 1525   KETONESUR NEGATIVE 10/10/2019 1525   PROTEINUR NEGATIVE 10/10/2019 1525   NITRITE NEGATIVE 10/10/2019 1525  LEUKOCYTESUR LARGE (A) 10/10/2019 1525   Urine Drug Screen No results found for: LABOPIA, COCAINSCRNUR, LABBENZ, AMPHETMU, THCU, LABBARB  Alcohol Level No results found for: [facility]   SIGNIFICANT DIAGNOSTIC STUDIES 10/07/19 CT Head No acute intracranial findings. Old left cerebellar stroke.    10/07/19 CT Perfusion  Occlusion of a proximal right M2 branch with some reconstitution more distally.Perfusion imaging demonstrates no evidence of core infarction. However, there is 33 mL of penumbra identified in the posterior right MCA territory. No measurable stenosis in the neck.1 mm outpouching of the distal supraclinoid left ICA likely reflecting an infundibulum. Aspects score is 10.    10/07/19 CT Angio Head and Neck  Neck findings: Great vessel origins are patent. Right carotid system is patent. Partially retropharyngeal course of the ICA. No measurable stenosis. Left carotid system is patent with no measurable stenosis.    Head findings: Anterior circulation: Intracranial internal carotid arteries are patent with mild calcified plaque. There is a 1 mm inferiorly directed outpouching of the distal supraclinoid left ICA. Appears to be a diminutive branch arising from this area. Right M1 MCA is patent. There is occlusion of a proximal right M2 MCA branch with some reconstitution more distally. Left middle and both anterior cerebral arteries are patent. An anterior communicating artery is present.  Posterior circulation: Intracranial vertebral arteries, basilar artery, and posterior cerebral arteries are patent.    10/07/19 MRI Brain WO Contrast Few small foci of scattered patchy infarction in the right MCA  territory as noted above, most notable along the deep insular cortex, right lateral temporal cortex, and right parietal deep and subcortical white matter. No large confluent acute infarction. No mass effect or evidence of parenchymal hemorrhage. One can appreciate signal loss associated with the occluded right MCA branch in the insula on the susceptibility weighted imaging.Old left cerebellar infarction. Chronic small-vessel ischemic changes of the cerebral hemispheric white matter.   10/07/19 CXR 2 View Normal sized heart. Clear lungs with normal vascularity. Thoracic spine degenerative changes. Diffuse osteopenia.   10/08/19 Echocardiogram Complete  Ejection Fraction 60-65 %, left ventricle has normal function, right ventricular systolic function is normal, left atrium normal in size, right atrium normal in size, no evidence of pericardial effusion, no mitral tricuspid or aortic valvular regurgitation noted   10/09/19 CXR 2 View  Trachea is midline. Heart size within normal limits. Streaky atelectasis or scarring in the left lower lobe. Lungs are otherwise clear. No pleural fluid. Extensive osteophytosis in the thoracic spine.  10/10/19 ECG - NSR 75 BPM  10/10/19 ECG  - atrial fibrillation - ventricular response 145 BPM     HISTORY OF PRESENT ILLNESS (From Dr Rollene Fare consult note 10/07/19) Alyssa Small is a 74 y.o. female with history of stroke, hypertension and GERD.  Patient was found to be last known normal at 8 AM.  At that time family noted that there was left facial droop and brought patient to the emergency department for further evaluation.  In the ED she was initially felt to be normal but then later noted to again have left-sided symptoms thus code stroke was called while in triage.  On exam however in the CT room there was no noted left-sided weakness and or facial droop however she did have left-sided neglect.  She unfortunately was outside the window for TPA.  CTA was ordered to further  evaluate for possible large vessel occlusion. Currently patient still continues to feel there is nothing wrong with her. LKW: 8 AM this morning tpa given?:  no, out of window Premorbid modified Rankin scale (mRS): 0 NIHSS 1a Level of Conscious.: 0 1b LOC Questions: 0 1c LOC Commands: 0 2 Best Gaze: 0 3 Visual: 0 4 Facial Palsy: 0 5a Motor Arm - left: 0 5b Motor Arm - Right: 0 6a Motor Leg - Left: 0 6b Motor Leg - Right: 0 7 Limb Ataxia: 0 8 Sensory: 0 9 Best Language: 0 10 Dysarthria: 0 11 Extinct. and Inatten.: 1 TOTAL: 1   HOSPITAL COURSE [name] is a 74 y.o. female w/pmh of L PICA Stroke(10/29/14), HTN, HLD, UTI, Depression, Arthritis, Hypothyroidism who presents with a R MCA Stroke that is cardioembolic in the setting of newly diagnosed atrial fibrillation that was found this hospital admission. Her CTA Head and Neck upon admission was notable for a right M2 branch occlusion. She was not a candidate for IVTPA as she was outside of the time window to receive it. Her NIHSS was 1 on admission and given low NIHSS, after discussion with interventional radiology, the decision was made to defer mechanical thrombectomy.   NEURO #R MCA Stroke in the setting of right M2 occlusion, cardioembolic in the setting of newly found atrial fibrillation  Her stroke work is complete at this time. Her MRI revealed right MCA patchy small infarcts. Echocardiogram showed relatively normal heart function and was negative for intracardiac source of stroke. Stroke labs were done including lipid panel w/LDL 81, hemoglobin A1C 5.5 She was originally placed on DAPT with the plan to transition to Eliquis however due to concern for infection/diffuse rash, Plavix was discontinued on 10/09/19 and initiating Eliquis was postponed. On 10/11/19, with the rash confirmed as a contact dermatitis and infectious work up negative so far, Eliquis 5 mg BID was initiated. Aspirin was discontinued on 10/10/19 in anticipation  of starting Eliquis.  - Eliquis 5 mg BID for secondary stroke prevention - Continue Zocor 40 mg for secondary stroke prevention   #Depression She has a history of depression and her home medications Celexa 40 mg and Trazadone 50 mg were resumed for this.   CARDS #Hypertension #Atrial Fibrillation  At home she takes Metoprolol XL 25 mg for blood pressure control. Currently her blood pressure is running normotensive and she was started on Metoprolol 25 Q6 by cardiology for rate control. This was transitioned to Metoprolol 50 mg BID by Cardiology on 10/10/19. Will continue to trend blood pressure and her rate, will defer to Cardiology for metoprolol adjustments. - Metoprolol 50 mg BID    #Hyperlipidemia She takes Zocor 20 mg at home for HLD, this was increased to 40 mg this admission for better cholesterol control. Her LDL is 81, goal LDL from a stroke prevention standpoint is < 70. She is to continue Zocor 40 mg at discharge.    RENAL #Hypokalemia  Overall chemistries have been stable on 10/10/19 she was noted to have low potassium at 3.1 This was supplemented and as of 10/11/19 her potassium is 4.8  - Continue to trend BMP    ENDO #Hypothyroidism   Continue with home Levothyroxine 75 mcg daily.    GI #Acute dysphagia  Passed swallow evaluation with speech on 10/08/19. They recommended a NDD3 diet with thin liquids given dysphagia    ID  #Leukocytosis  Her WBC count jumped from 7.0 ( noted on admission CBC 10/07/19) to 24.3 as of 10/09/19. Subsequently it was noted to be 27.5, 26.4 and today on 10/11/19 21.5. It appears to be down trending. She fevered on 8/10 +  8/12 with temperature noted to be 100.3 and 100.2 respectively. No low grade fever as of 10/11/19. Infectious work up was initiated and the hospitalist team was consulted on 10/09/19. CXR, UA, Blood cultures and Lactic Acid were ordered as a part of infectious work up. Per the hospitalist team, the leukocytosis could be reactive in the  setting of her contact dermatitis. There are no clinical signs of a bacterial or urinary tract infection and it is recommended to continue to hold antibiotic therapy.  - 10/09/19 CXR w/no infectious process such as pneumonia, no acute findings  - 8/11/211 Blood cultures w/no growth so far  - 10/10/19 UA negative for UTI  - Lactic acid elevated at 2.9/2.3  - Continue to trend temperature and CBC   HEME  H/H/P are stable. SCD's + Eliquis for DVT Prophylaxis  - Daily CBC    SKIN #Contact Dermatitis w/rective leukocytosis After being admitted, she developed a diffuse rash to her chest, bilateral arms and bilateral legs. The rash is itchy at times. She was given Benadryl 50 mg PO X1 on 10/08/19 that offered her some relief. The cause of the rash is unknown. As a precaution, her Plavix was discontinued on 10/09/19 given that it was a newly started medication that she was not previously on. The patient states that this has happened in the past while hospitalized related to her bed sheets.    She was seen by the medicine team and ultimately it was felt that she has contact dermatitis with reactive leukocytosis. She is now on scheduled Benadryl and Hydrocortisone cream. Sheets have been brought in by her family. Per hospitalist team, "continue benadryl scheduled 25 mg tid for a total of 3 days and continue topical hydrocortisone 0,5% for 7days. No clinical signs of bacterial or urinary tract infection, continue to hold on antibiotic therapy."  - Continue benadryl and hydrocortisone as directed above - Continue to f/u hospitalist recs    #Other Stroke Risk Factors Her other stroke risk factors include advanced age.    DISCHARGE EXAM Vitals:   10/11/19 1929 10/11/19 2326 10/12/19 0324 10/12/19 0829  BP: (!) 107/55 116/64 (!) 115/50 (!) 137/54  Pulse: 85 75 74   Resp: 18 18 18 18   Temp: 98.6 F (37 C) 98.6 F (37 C) 98.1 F (36.7 C) 98.4 F (36.9 C)  TempSrc: Oral Oral Oral Oral  SpO2: 100% 97%  99% 98%   Physical Examination: General: Well nourished, well developed, in no apparent distress Skin: Redness noted to chest, bilateral arms and bilateral legs, white papules noted to face are gone    Neurological Exam:  Mental status: AAOx4, follows commands  Speech: Fluent with repetition and naming intact, no dysarthria  Cranial nerves: EOMI, VFF, Face symmetric, shoulder shrug intact  Motor: Strength 5/5 throughout, no drift noted to any extremity  Coordination: No ataxia w/FNF or HTS  Sensation: Intact to light touch throughout  Gait: Deferred  NIHSS: 0    DISCHARGE INSTRUCTIONS GIVEN TO THE PATIENT 1. Please report any abnormal bleeding to your physician.  2. Home therapies will be arranged - PT, OT and speech. 3. 24 hour per day supervision / assistance has been recommended for safety. 4. Please take all of your medications as prescribed. 5. If you do not have a primary care provider please select one and schedule an appointment.   DISCHARGE DIET   Diet Order             Diet regular Room service appropriate? Yes;  Fluid consistency: Thin  Diet effective now                  liquids  DISCHARGE PLAN Disposition:  Discharge to home - 24 hr per day supervision / assistance for safety reasons recommended Eliquis (apixaban) daily for secondary stroke prevention. Ongoing risk factor control by Primary Care Physician at time of discharge Follow-up Patient, No Pcp Per in 2 weeks. Follow-up in [facility] in 4 weeks, office to schedule an appointment.  Follow-up with Cardiology as instructed  40 minutes were spent preparing discharge.  Delton See PA-C [facility] Pager [phone] 10/12/2019, 12:54 PM Delia Heady, MD Medical Director [facility] Pager: [phone] 10/12/2019 1:52 PM

## 2019-10-14 LAB — CULTURE, BLOOD (ROUTINE X 2)
Culture: NO GROWTH
Culture: NO GROWTH
Special Requests: ADEQUATE

## 2019-10-24 ENCOUNTER — Ambulatory Visit (HOSPITAL_COMMUNITY): Payer: Medicare HMO | Admitting: Physician Assistant

## 2019-11-05 ENCOUNTER — Ambulatory Visit (HOSPITAL_COMMUNITY)
Admission: RE | Admit: 2019-11-05 | Discharge: 2019-11-05 | Disposition: A | Discharge status: Home / Self Care | Payer: Medicare HMO | Source: Ambulatory Visit | Attending: Physician Assistant | Admitting: Physician Assistant

## 2019-11-05 ENCOUNTER — Encounter (HOSPITAL_COMMUNITY): Payer: Self-pay | Admitting: Physician Assistant

## 2019-11-05 ENCOUNTER — Other Ambulatory Visit: Payer: Self-pay

## 2019-11-05 VITALS — BP 132/90 | HR 75 | Ht 66.0 in | Wt 189.0 lb

## 2019-11-05 DIAGNOSIS — E669 Obesity, unspecified: Secondary | ICD-10-CM | POA: Diagnosis not present

## 2019-11-05 DIAGNOSIS — I480 Paroxysmal atrial fibrillation: Secondary | ICD-10-CM | POA: Diagnosis present

## 2019-11-05 DIAGNOSIS — K219 Gastro-esophageal reflux disease without esophagitis: Secondary | ICD-10-CM | POA: Diagnosis not present

## 2019-11-05 DIAGNOSIS — Z7901 Long term (current) use of anticoagulants: Secondary | ICD-10-CM | POA: Diagnosis not present

## 2019-11-05 DIAGNOSIS — D6869 Other thrombophilia: Secondary | ICD-10-CM | POA: Diagnosis not present

## 2019-11-05 DIAGNOSIS — Z79890 Hormone replacement therapy (postmenopausal): Secondary | ICD-10-CM | POA: Insufficient documentation

## 2019-11-05 DIAGNOSIS — Z6830 Body mass index (BMI) 30.0-30.9, adult: Secondary | ICD-10-CM | POA: Diagnosis not present

## 2019-11-05 DIAGNOSIS — Z79899 Other long term (current) drug therapy: Secondary | ICD-10-CM | POA: Insufficient documentation

## 2019-11-05 DIAGNOSIS — Z8673 Personal history of transient ischemic attack (TIA), and cerebral infarction without residual deficits: Secondary | ICD-10-CM | POA: Insufficient documentation

## 2019-11-05 DIAGNOSIS — I10 Essential (primary) hypertension: Secondary | ICD-10-CM | POA: Diagnosis not present

## 2019-11-05 LAB — CBC
HCT: 41.2 % (ref 36.0–46.0)
Hemoglobin: 13.4 g/dL (ref 12.0–15.0)
MCH: 30.8 pg (ref 26.0–34.0)
MCHC: 32.5 g/dL (ref 30.0–36.0)
MCV: 94.7 fL (ref 80.0–100.0)
Platelets: 369 10*3/uL (ref 150–400)
RBC: 4.35 MIL/uL (ref 3.87–5.11)
RDW: 15.8 % — ABNORMAL HIGH (ref 11.5–15.5)
WBC: 7.9 10*3/uL (ref 4.0–10.5)
nRBC: 0 % (ref 0.0–0.2)

## 2019-11-05 LAB — BASIC METABOLIC PANEL
Anion gap: 9 (ref 5–15)
BUN: 11 mg/dL (ref 8–23)
CO2: 25 mmol/L (ref 22–32)
Calcium: 9.3 mg/dL (ref 8.9–10.3)
Chloride: 105 mmol/L (ref 98–111)
Creatinine, Ser: 0.78 mg/dL (ref 0.44–1.00)
GFR calc Af Amer: >60 mL/min (ref >60)
GFR calc non Af Amer: >60 mL/min (ref >60)
Glucose, Bld: 101 mg/dL — ABNORMAL HIGH (ref 70–99)
Potassium: 3.9 mmol/L (ref 3.5–5.1)
Sodium: 139 mmol/L (ref 135–145)

## 2019-11-05 NOTE — Progress Notes (Signed)
Primary Care Physician: Patient, No Pcp Per Primary Cardiologist: Dr Antoine Poche  Primary Electrophysiologist: none Referring Physician: Dr Tana Conch Muckey is a 74 y.o. female with a history of HTN, prior CVA, GERD and paroxysmal atrial fibrillation who presents for consultation in the [facility]. The patient was initially diagnosed with atrial fibrillation 10/08/19 while hospitalized for an acute CVA. ECG showed afib with RVR. She denied any symptoms with her arrhythmia. Patient was started on Eliquis for a CHADS2VASC score of 6. She converted to SR spontaneously during her admission. She has done well since leaving the hospital. She denies any bleeding issues with anticoagulation. She is in SR. Patient denies significant snoring or alcohol use.   Today, she denies symptoms of palpitations, chest pain, shortness of breath, orthopnea, PND, lower extremity edema, dizziness, presyncope, syncope, snoring, daytime somnolence, bleeding, or neurologic sequela. The patient is tolerating medications without difficulties and is otherwise without complaint today.    Atrial Fibrillation Risk Factors:  she does not have symptoms or diagnosis of sleep apnea. she does have a history of rheumatic fever. Dx when she was 74 y/o. she does not have a history of alcohol use. The patient does not have a history of early familial atrial fibrillation or other arrhythmias.  she has a BMI of Body mass index is 30.51 kg/m.Marland Kitchen Filed Weights   11/05/19 1352  Weight: 85.7 kg    Family History  Problem Relation Age of Onset  . Hypertension Mother   . Hypertension Father      Atrial Fibrillation Management history:  Previous antiarrhythmic drugs: none Previous cardioversions: none Previous ablations: none CHADS2VASC score: 6 Anticoagulation history: Eliquis   Past Medical History:  Diagnosis Date  . Arthritis   . GERD (gastroesophageal reflux disease)   . Hypertension    . Stroke [facility])    2007 no deficits    No past surgical history on file.  Current Outpatient Medications  Medication Sig Dispense Refill  . albuterol (VENTOLIN HFA) 108 (90 Base) MCG/ACT inhaler Inhale 2 puffs into the lungs every 4 (four) hours as needed for shortness of breath.    Marland Kitchen apixaban (ELIQUIS) 5 MG TABS tablet Take 1 tablet (5 mg total) by mouth 2 (two) times daily. 60 tablet 2  . busPIRone (BUSPAR) 10 MG tablet Take 10 mg by mouth 3 (three) times daily as needed.    . citalopram (CELEXA) 20 MG tablet Take 1 tablet (20 mg total) by mouth daily. 30 tablet 2  . diphenhydrAMINE (BENADRYL) 25 mg capsule Take 25 mg by mouth every 6 (six) hours as needed for itching.    . famotidine (PEPCID) 40 MG tablet Take 40 mg by mouth daily.    . fluticasone (FLONASE) 50 MCG/ACT nasal spray Place 1 spray into both nostrils daily as needed for allergies.    Marland Kitchen levothyroxine (SYNTHROID) 75 MCG tablet Take 75 mcg by mouth every morning.    . metoprolol tartrate (LOPRESSOR) 50 MG tablet Take 1 tablet (50 mg total) by mouth 2 (two) times daily. 60 tablet 2  . pantoprazole (PROTONIX) 40 MG tablet Take 40 mg by mouth daily.    . simvastatin (ZOCOR) 40 MG tablet Take 1 tablet (40 mg total) by mouth at bedtime. 30 tablet 2  . traZODone (DESYREL) 50 MG tablet Take 50 mg by mouth at bedtime.    . Vitamin D, Ergocalciferol, (DRISDOL) 1.25 MG (50000 UNIT) CAPS capsule Take 50,000 Units by mouth every 14 (fourteen)  days.     No current facility-administered medications for this encounter.    Allergies  Allergen Reactions  . Atorvastatin Other (See Comments)    Muscle spasom Muscle spasom   . Zolpidem Other (See Comments)    confusion confusion   . Ceftriaxone Rash    Other reaction(s): Rash Redness  Redness     Social History   Socioeconomic History  . Marital status: Unknown    Spouse name: Not on file  . Number of children: Not on file  . Years of education: Not on file  . Highest  education level: Not on file  Occupational History  . Not on file  Tobacco Use  . Smoking status: Never Smoker  . Smokeless tobacco: Never Used  Substance and Sexual Activity  . Alcohol use: Never  . Drug use: Never  . Sexual activity: Not on file  Other Topics Concern  . Not on file  Social History Narrative  . Not on file   Social Determinants of Health   Financial Resource Strain:   . Difficulty of Paying Living Expenses: Not on file  Food Insecurity:   . Worried About Programme researcher, broadcasting/film/video in the Last Year: Not on file  . Ran Out of Food in the Last Year: Not on file  Transportation Needs:   . Lack of Transportation (Medical): Not on file  . Lack of Transportation (Non-Medical): Not on file  Physical Activity:   . Days of Exercise per Week: Not on file  . Minutes of Exercise per Session: Not on file  Stress:   . Feeling of Stress : Not on file  Social Connections:   . Frequency of Communication with Friends and Family: Not on file  . Frequency of Social Gatherings with Friends and Family: Not on file  . Attends Religious Services: Not on file  . Active Member of Clubs or Organizations: Not on file  . Attends Banker Meetings: Not on file  . Marital Status: Not on file  Intimate Partner Violence:   . Fear of Current or Ex-Partner: Not on file  . Emotionally Abused: Not on file  . Physically Abused: Not on file  . Sexually Abused: Not on file     ROS- All systems are reviewed and negative except as per the HPI above.  Physical Exam: Vitals:   11/05/19 1352  BP: 132/90  Pulse: 75  Weight: 85.7 kg  Height: 5\' 6"  (1.676 m)    GEN- The patient is well appearing obese elderly female, alert and oriented x 3 today.   Head- normocephalic, atraumatic Eyes-  Sclera clear, conjunctiva pink Ears- hearing intact Oropharynx- clear Neck- supple  Lungs- Clear to ausculation bilaterally, normal work of breathing Heart- Regular rate and rhythm, occasional  ectopic beat, no murmurs, rubs or gallops  GI- soft, NT, ND, + BS Extremities- no clubbing, cyanosis, or edema MS- no significant deformity or atrophy Skin- no rash or lesion Psych- euthymic mood, full affect Neuro- strength and sensation are intact  Wt Readings from Last 3 Encounters:  11/05/19 85.7 kg    EKG today demonstrates SR HR 75, PVCs, PR 160, QRS 76, QTc 462  Echo 10/08/19 demonstrated  1. Left ventricular ejection fraction, by estimation, is 60 to 65%. The  left ventricle has normal function. The left ventricle has no regional  wall motion abnormalities. Left ventricular diastolic parameters are  consistent with Grade II diastolic  dysfunction (pseudonormalization). Elevated left atrial pressure.  2. Right ventricular  systolic function is normal. The right ventricular  size is normal.  3. The mitral valve is normal in structure. No evidence of mitral valve  regurgitation. No evidence of mitral stenosis.  4. The aortic valve is normal in structure. Aortic valve regurgitation is  not visualized. No aortic stenosis is present.  5. The inferior vena cava is normal in size with greater than 50%  respiratory variability, suggesting right atrial pressure of 3 mmHg.   Epic records are reviewed at length today  CHA2DS2-VASc Score = 6  The patient's score is based upon: CHF History: 0 HTN History: 1 Age : 2 Diabetes History: 0 Stroke History: 2 Vascular Disease History: 0 Gender: 1      ASSESSMENT AND PLAN: 1. Paroxysmal Atrial Fibrillation (ICD10:  I48.0) The patient's CHA2DS2-VASc score is 6, indicating a 9.7% annual risk of stroke.   General education about afib provided and questions answered. We also discussed her stroke risk and the risks and benefits of anticoagulation. Continue Eliquis 5 mg BID Check bmet/CBC today. Continue Lopressor 50 mg BID  2. Secondary Hypercoagulable State (ICD10:  D68.69) The patient is at significant risk for  stroke/thromboembolism based upon her CHA2DS2-VASc Score of 6.  Continue Apixaban (Eliquis).   3. Obesity Body mass index is 30.51 kg/m. Lifestyle modification was discussed at length including regular exercise and weight reduction.  4. HTN Stable, no changes today.   Follow up with Dr Antoine Poche as scheduled.    Jorja Loa PA-C Af[facility] [address] [place], [place] [address] [phone] 11/05/2019 2:05 PM

## 2019-11-12 ENCOUNTER — Inpatient Hospital Stay: Payer: Medicare HMO | Admitting: Adult Health

## 2019-12-05 DIAGNOSIS — Z7189 Other specified counseling: Secondary | ICD-10-CM | POA: Insufficient documentation

## 2019-12-05 DIAGNOSIS — I493 Ventricular premature depolarization: Secondary | ICD-10-CM | POA: Insufficient documentation

## 2019-12-05 DIAGNOSIS — I10 Essential (primary) hypertension: Secondary | ICD-10-CM | POA: Insufficient documentation

## 2019-12-05 NOTE — Progress Notes (Signed)
Cardiology Office Note   Date:  12/06/2019   ID:  Alyssa Small, DOB Jun 08, 1945, MRN [id number]  PCP:  Shellia Cleverly, PA  Cardiologist:   Rollene Rotunda, MD   Chief Complaint  Patient presents with  . Shortness of Breath     History of Present Illness: Alyssa Small is a 74 y.o. female who presents for follow up of atrial fib.  Somewhat difficult to understand how she has been feeling since she has been home.  She gets around.  Slowly because of back pain.  She uses a walker.  She says she has had less energy.  She fatigues more easily.  She does not describe shortness of breath when she is doing activities walking around but she does say she is short of breath and has to take deep breaths when she is done doing what she is doing.  Today I walked around the office and her sats went into the 80s starting at a low of 90% but she felt relatively well with that.  She is not describing PND or orthopnea.  She is not having chest pressure, neck or arm discomfort.  She was having a lot of trouble with acid reflux and cough but thinks that is gotten better.  She is not having any new swelling.  She did not have any real residual from her CVA as she describes it.  She is not having any palpitations.  She would not know if she was in atrial fibrillation and has not been documented since the hospital.  She never felt it at that time either.    Past Medical History:  Diagnosis Date  . Arthritis   . GERD (gastroesophageal reflux disease)   . Hypertension   . Stroke [facility])    2007 no deficits     History reviewed. No pertinent surgical history.   Current Outpatient Medications  Medication Sig Dispense Refill  . albuterol (VENTOLIN HFA) 108 (90 Base) MCG/ACT inhaler Inhale 2 puffs into the lungs every 4 (four) hours as needed for shortness of breath.    Marland Kitchen apixaban (ELIQUIS) 5 MG TABS tablet Take 1 tablet (5 mg total) by mouth 2 (two) times daily. 60 tablet 2  . busPIRone (BUSPAR) 10 MG tablet  Take 10 mg by mouth 3 (three) times daily as needed.    . citalopram (CELEXA) 20 MG tablet Take 1 tablet (20 mg total) by mouth daily. 30 tablet 2  . diphenhydrAMINE (BENADRYL) 25 mg capsule Take 25 mg by mouth every 6 (six) hours as needed for itching.    . famotidine (PEPCID) 40 MG tablet Take 40 mg by mouth daily.    . fluticasone (FLONASE) 50 MCG/ACT nasal spray Place 1 spray into both nostrils daily as needed for allergies.    Marland Kitchen levothyroxine (SYNTHROID) 75 MCG tablet Take 75 mcg by mouth every morning.    . metoprolol tartrate (LOPRESSOR) 50 MG tablet Take 1 tablet (50 mg total) by mouth 2 (two) times daily. 60 tablet 2  . pantoprazole (PROTONIX) 40 MG tablet Take 40 mg by mouth daily.    . simvastatin (ZOCOR) 40 MG tablet Take 1 tablet (40 mg total) by mouth at bedtime. 30 tablet 2  . traZODone (DESYREL) 50 MG tablet Take 50 mg by mouth at bedtime.    . Vitamin D, Ergocalciferol, (DRISDOL) 1.25 MG (50000 UNIT) CAPS capsule Take 50,000 Units by mouth every 14 (fourteen) days.     No current facility-administered medications for this visit.  Allergies:   Atorvastatin, Zolpidem, and Ceftriaxone    ROS:  Please see the history of present illness.   Otherwise, review of systems are positive for none.   All other systems are reviewed and negative.    PHYSICAL EXAM: VS:  BP 130/70   Pulse 60   Temp (!) 97.2 F (36.2 C)   Ht 5\' 6"  (1.676 m)   Wt 173 lb (78.5 kg)   SpO2 98%   BMI 27.92 kg/m  , BMI Body mass index is 27.92 kg/m. GEN:  No distress NECK:  No jugular venous distention at 90 degrees, waveform within normal limits, carotid upstroke brisk and symmetric, no bruits, no thyromegaly LYMPHATICS:  No cervical adenopathy LUNGS:  Clear to auscultation bilaterally BACK:  No CVA tenderness CHEST:  Unremarkable HEART:  S1 and S2 within normal limits, no S3, no S4, no clicks, no rubs, no murmurs ABD:  Positive bowel sounds normal in frequency in pitch, no bruits, no rebound, no  guarding, unable to assess midline mass or bruit with the patient seated. EXT:  2 plus pulses throughout, no edema, no cyanosis no clubbing SKIN:  No rashes no nodules NEURO:  Cranial nerves II through XII grossly intact, motor grossly intact throughout PSYCH:  Cognitively intact, oriented to person place and time   EKG:  EKG is not ordered today.    Recent Labs: 10/07/2019: ALT 12 10/09/2019: TSH 1.729 10/11/2019: Magnesium 1.5 11/05/2019: BUN 11; Creatinine, Ser 0.78; Hemoglobin 13.4; Platelets 369; Potassium 3.9; Sodium 139    Lipid Panel    Component Value Date/Time   CHOL 171 10/08/2019 0433   TRIG 43 10/08/2019 0433   HDL 81 10/08/2019 0433   CHOLHDL 2.1 10/08/2019 0433   VLDL 9 10/08/2019 0433   LDLCALC 81 10/08/2019 0433      Wt Readings from Last 3 Encounters:  12/06/19 173 lb (78.5 kg)  11/05/19 189 lb (85.7 kg)      Other studies Reviewed: Additional studies/ records that were reviewed today include: Hospital records. Review of the above records demonstrates:  Please see elsewhere in the note.     ASSESSMENT AND PLAN:  Recurrent stroke, likely cardioembolic:   She is on anticoagulation.  No change in therapy.  Newly recognized atrial fibrillation:    [name] has a CHA2DS2 - VASc score of 6.  She will continue the meds as listed.  She wondered about reducing back down the dose of metoprolol.  Her niece talked about that quite a bit as she thought it was causing symptoms of acid reflux or cough initially but these have improved so no change in therapy.  Essential HTN: She will continue the meds as listed.  Hypoxemia: She gives a vague history of being short of breath but no distress.  She did have lower oxygen was walking around the office.  I am going to get overnight pulse oximetry on her and she may need oxygen and referral to pulmonary.  I do not get the sense that she has any volume overload.    Current medicines are reviewed at length with the  patient today.  The patient does not have concerns regarding medicines.  The following changes have been made:  no change  Labs/ tests ordered today include: Overnight pulse oximetry No orders of the defined types were placed in this encounter.    Disposition:   FU with me in a few months.   Signed, Cecille Amsterdam, MD  12/06/2019 5:26 PM  [facility]

## 2019-12-06 ENCOUNTER — Other Ambulatory Visit: Payer: Self-pay | Admitting: Cardiology

## 2019-12-06 ENCOUNTER — Other Ambulatory Visit: Payer: Self-pay

## 2019-12-06 ENCOUNTER — Ambulatory Visit: Payer: Medicare HMO | Admitting: Cardiology

## 2019-12-06 ENCOUNTER — Encounter: Payer: Self-pay | Admitting: Cardiology

## 2019-12-06 VITALS — BP 130/70 | HR 60 | Temp 97.2°F | Ht 66.0 in | Wt 173.0 lb

## 2019-12-06 DIAGNOSIS — I10 Essential (primary) hypertension: Secondary | ICD-10-CM | POA: Diagnosis not present

## 2019-12-06 DIAGNOSIS — I493 Ventricular premature depolarization: Secondary | ICD-10-CM

## 2019-12-06 DIAGNOSIS — R0902 Hypoxemia: Secondary | ICD-10-CM

## 2019-12-06 DIAGNOSIS — I480 Paroxysmal atrial fibrillation: Secondary | ICD-10-CM | POA: Diagnosis not present

## 2019-12-06 DIAGNOSIS — I639 Cerebral infarction, unspecified: Secondary | ICD-10-CM | POA: Diagnosis not present

## 2019-12-06 NOTE — Patient Instructions (Signed)
Medication Instructions:  Your physician recommends that you continue on your current medications as directed. Please refer to the Current Medication list given to you today.  OK to take tramadol if needed  *If you need a refill on your cardiac medications before your next appointment, please call your pharmacy*  Testing/Procedures: Overnight Oximetry study   Follow-Up: At [facility], you and your health needs are our priority.  As part of our continuing mission to provide you with exceptional heart care, we have created designated Provider Care Teams.  These Care Teams include your primary Cardiologist (physician) and Advanced Practice Providers (APPs -  Physician Assistants and Nurse Practitioners) who all work together to provide you with the care you need, when you need it.  We recommend signing up for the patient portal called "MyChart".  Sign up information is provided on this After Visit Summary.  MyChart is used to connect with patients for Virtual Visits (Telemedicine).  Patients are able to view lab/test results, encounter notes, upcoming appointments, etc.  Non-urgent messages can be sent to your provider as well.   To learn more about what you can do with MyChart, go to [email or web address].    Your next appointment:   3 month(s)  The format for your next appointment:   In Person  Provider:   You will see one of the following Advanced Practice Providers on your designated Care Team:    Theodore Demark, PA-C  Joni Reining, DNP, ANP

## 2019-12-09 ENCOUNTER — Other Ambulatory Visit: Payer: Self-pay | Admitting: Cardiology

## 2019-12-09 ENCOUNTER — Other Ambulatory Visit: Payer: Self-pay | Admitting: *Deleted

## 2019-12-09 DIAGNOSIS — G4736 Sleep related hypoventilation in conditions classified elsewhere: Secondary | ICD-10-CM

## 2019-12-09 DIAGNOSIS — R0902 Hypoxemia: Secondary | ICD-10-CM

## 2019-12-16 ENCOUNTER — Inpatient Hospital Stay: Payer: Medicare HMO | Admitting: Adult Health

## 2019-12-25 ENCOUNTER — Inpatient Hospital Stay: Payer: Medicare HMO | Admitting: Adult Health

## 2019-12-28 ENCOUNTER — Ambulatory Visit: Payer: Medicare HMO

## 2020-01-02 ENCOUNTER — Inpatient Hospital Stay: Payer: Medicare HMO | Admitting: Adult Health

## 2020-03-16 NOTE — Progress Notes (Deleted)
Cardiology Office Note   Date:  03/16/2020   ID:  Alyssa Small, DOB Apr 05, 1945, MRN [id number]  PCP:  Alyssa Cleverly, PA  Cardiologist:  Dr. Antoine Poche  No chief complaint on file.    History of Present Illness: Alyssa Small is a 75 y.o. female who presents for ongoing assessment and management of atrial fibrillation.  She remains on apixaban 5 mg twice daily CHADS VASc score of 6.  When last seen by Dr. Antoine Poche on 12/06/2019 she was having some shortness of breath with exertion, fatigue, using a walker for ambulation.  She also complained of a good bit of acid reflux.  She has a history of cardioembolic CVA, hypertension, GERD, and arthritis.  At that office visit with Dr. Antoine Poche, she walked in the office with an oxygen saturation monitor, with oxygen reduction to 80%.  Overnight pulse oximetry was ordered with possibility of referral to pulmonology.  She did not appear to be volume overloaded.    Past Medical History:  Diagnosis Date  . Arthritis   . GERD (gastroesophageal reflux disease)   . Hypertension   . Stroke [facility])    2007 no deficits     No past surgical history on file.   Current Outpatient Medications  Medication Sig Dispense Refill  . albuterol (VENTOLIN HFA) 108 (90 Base) MCG/ACT inhaler Inhale 2 puffs into the lungs every 4 (four) hours as needed for shortness of breath.    Marland Kitchen apixaban (ELIQUIS) 5 MG TABS tablet Take 1 tablet (5 mg total) by mouth 2 (two) times daily. 60 tablet 2  . busPIRone (BUSPAR) 10 MG tablet Take 10 mg by mouth 3 (three) times daily as needed.    . citalopram (CELEXA) 20 MG tablet Take 1 tablet (20 mg total) by mouth daily. 30 tablet 2  . diphenhydrAMINE (BENADRYL) 25 mg capsule Take 25 mg by mouth every 6 (six) hours as needed for itching.    . famotidine (PEPCID) 40 MG tablet Take 40 mg by mouth daily.    . fluticasone (FLONASE) 50 MCG/ACT nasal spray Place 1 spray into both nostrils daily as needed for allergies.    Marland Kitchen levothyroxine  (SYNTHROID) 75 MCG tablet Take 75 mcg by mouth every morning.    . metoprolol tartrate (LOPRESSOR) 50 MG tablet Take 1 tablet (50 mg total) by mouth 2 (two) times daily. 60 tablet 2  . pantoprazole (PROTONIX) 40 MG tablet Take 40 mg by mouth daily.    . simvastatin (ZOCOR) 40 MG tablet Take 1 tablet (40 mg total) by mouth at bedtime. 30 tablet 2  . traZODone (DESYREL) 50 MG tablet Take 50 mg by mouth at bedtime.    . Vitamin D, Ergocalciferol, (DRISDOL) 1.25 MG (50000 UNIT) CAPS capsule Take 50,000 Units by mouth every 14 (fourteen) days.     No current facility-administered medications for this visit.    Allergies:   Atorvastatin, Zolpidem, and Ceftriaxone    Social History:  The patient  reports that she has never smoked. She has never used smokeless tobacco. She reports that she does not drink alcohol and does not use drugs.   Family History:  The patient's family history includes Hypertension in her father and mother.    ROS: All other systems are reviewed and negative. Unless otherwise mentioned in H&P    PHYSICAL EXAM: VS:  There were no vitals taken for this visit. , BMI There is no height or weight on file to calculate BMI. GEN: Well nourished, well developed, in  no acute distress HEENT: normal Neck: no JVD, carotid bruits, or masses Cardiac: ***RRR; no murmurs, rubs, or gallops,no edema  Respiratory:  Clear to auscultation bilaterally, normal work of breathing GI: soft, nontender, nondistended, + BS MS: no deformity or atrophy Skin: warm and dry, no rash Neuro:  Strength and sensation are intact Psych: euthymic mood, full affect   EKG:  EKG {ACTION; IS/IS [id number]} ordered today. The ekg ordered today demonstrates ***   Recent Labs: 10/07/2019: ALT 12 10/09/2019: TSH 1.729 10/11/2019: Magnesium 1.5 11/05/2019: BUN 11; Creatinine, Ser 0.78; Hemoglobin 13.4; Platelets 369; Potassium 3.9; Sodium 139    Lipid Panel    Component Value Date/Time   CHOL 171 10/08/2019  0433   TRIG 43 10/08/2019 0433   HDL 81 10/08/2019 0433   CHOLHDL 2.1 10/08/2019 0433   VLDL 9 10/08/2019 0433   LDLCALC 81 10/08/2019 0433      Wt Readings from Last 3 Encounters:  12/06/19 173 lb (78.5 kg)  11/05/19 189 lb (85.7 kg)      Other studies Reviewed: Additional studies/ records that were reviewed today include: ***. Review of the above records demonstrates: ***   ASSESSMENT AND PLAN:  1.  ***   Current medicines are reviewed at length with the patient today.  I have spent *** dedicated to the care of this patient on the date of this encounter to include pre-visit review of records, assessment, management and diagnostic testing,with shared decision making.  Labs/ tests ordered today include: *** Bettey Mare. Liborio Nixon, ANP, [facility]   03/16/2020 11:33 AM    [facility] [address] Office [phone] Fax [phone]  Notice: This dictation was prepared with Dragon dictation along with smaller phrase technology. Any transcriptional errors that result from this process are unintentional and may not be corrected upon review.

## 2020-03-20 ENCOUNTER — Ambulatory Visit: Payer: Medicare HMO | Admitting: Adult Health

## 2020-04-07 NOTE — Progress Notes (Deleted)
Cardiology Office Note   Date:  04/07/2020   ID:  Alyssa Small, DOB 1945/10/02, MRN [id number]  PCP:  Shellia Cleverly, PA  Cardiologist: Dr. Antoine Poche No chief complaint on file.    History of Present Illness: Alyssa Small is a 75 y.o. female who presents for ongoing assessment and management of atrial fibrillation, hypertension, history of CVA in 2007 without deficits.  She was last seen by Dr. Antoine Poche on 12/06/2019 at which time she was moving very slowly because of back pain, and uses a walker for ambulation, and complained that she has had less energy and fatigues more easily.  She denied any cardiac complaints of chest discomfort, but did have mild dyspnea on exertion when walking around in the office., dizziness, or palpitations.  She was continued on anticoagulation, CHADS VASC Score of 6.     Past Medical History:  Diagnosis Date  . Arthritis   . GERD (gastroesophageal reflux disease)   . Hypertension   . Stroke [facility])    2007 no deficits     No past surgical history on file.   Current Outpatient Medications  Medication Sig Dispense Refill  . albuterol (VENTOLIN HFA) 108 (90 Base) MCG/ACT inhaler Inhale 2 puffs into the lungs every 4 (four) hours as needed for shortness of breath.    Marland Kitchen apixaban (ELIQUIS) 5 MG TABS tablet Take 1 tablet (5 mg total) by mouth 2 (two) times daily. 60 tablet 2  . busPIRone (BUSPAR) 10 MG tablet Take 10 mg by mouth 3 (three) times daily as needed.    . citalopram (CELEXA) 20 MG tablet Take 1 tablet (20 mg total) by mouth daily. 30 tablet 2  . diphenhydrAMINE (BENADRYL) 25 mg capsule Take 25 mg by mouth every 6 (six) hours as needed for itching.    . famotidine (PEPCID) 40 MG tablet Take 40 mg by mouth daily.    . fluticasone (FLONASE) 50 MCG/ACT nasal spray Place 1 spray into both nostrils daily as needed for allergies.    Marland Kitchen levothyroxine (SYNTHROID) 75 MCG tablet Take 75 mcg by mouth every morning.    . metoprolol tartrate (LOPRESSOR) 50 MG tablet  Take 1 tablet (50 mg total) by mouth 2 (two) times daily. 60 tablet 2  . pantoprazole (PROTONIX) 40 MG tablet Take 40 mg by mouth daily.    . simvastatin (ZOCOR) 40 MG tablet Take 1 tablet (40 mg total) by mouth at bedtime. 30 tablet 2  . traZODone (DESYREL) 50 MG tablet Take 50 mg by mouth at bedtime.    . Vitamin D, Ergocalciferol, (DRISDOL) 1.25 MG (50000 UNIT) CAPS capsule Take 50,000 Units by mouth every 14 (fourteen) days.     No current facility-administered medications for this visit.    Allergies:   Atorvastatin, Zolpidem, and Ceftriaxone    Social History:  The patient  reports that she has never smoked. She has never used smokeless tobacco. She reports that she does not drink alcohol and does not use drugs.   Family History:  The patient's family history includes Hypertension in her father and mother.    ROS: All other systems are reviewed and negative. Unless otherwise mentioned in H&P    PHYSICAL EXAM: VS:  There were no vitals taken for this visit. , BMI There is no height or weight on file to calculate BMI. GEN: Well nourished, well developed, in no acute distress HEENT: normal Neck: no JVD, carotid bruits, or masses Cardiac: ***RRR; no murmurs, rubs, or gallops,no edema  Respiratory:  Clear to auscultation bilaterally, normal work of breathing GI: soft, nontender, nondistended, + BS MS: no deformity or atrophy Skin: warm and dry, no rash Neuro:  Strength and sensation are intact Psych: euthymic mood, full affect   EKG:  EKG {ACTION; IS/IS [id number]:[id number]} ordered today. The ekg ordered today demonstrates ***   Recent Labs: 10/07/2019: ALT 12 10/09/2019: TSH 1.729 10/11/2019: Magnesium 1.5 11/05/2019: BUN 11; Creatinine, Ser 0.78; Hemoglobin 13.4; Platelets 369; Potassium 3.9; Sodium 139    Lipid Panel    Component Value Date/Time   CHOL 171 October 22, 2019 0433   TRIG 43 10-22-19 0433   HDL 81 10-22-19 0433   CHOLHDL 2.1 October 22, 2019 0433   VLDL 9 10-22-19  0433   LDLCALC 81 10-22-2019 0433      Wt Readings from Last 3 Encounters:  12/06/19 173 lb (78.5 kg)  11/05/19 189 lb (85.7 kg)      Other studies Reviewed: Echocardiogram 10-22-19 1. Left ventricular ejection fraction, by estimation, is 60 to 65%. The  left ventricle has normal function. The left ventricle has no regional  wall motion abnormalities. Left ventricular diastolic parameters are  consistent with Grade II diastolic  dysfunction (pseudonormalization). Elevated left atrial pressure.  2. Right ventricular systolic function is normal. The right ventricular  size is normal.  3. The mitral valve is normal in structure. No evidence of mitral valve  regurgitation. No evidence of mitral stenosis.  4. The aortic valve is normal in structure. Aortic valve regurgitation is  not visualized. No aortic stenosis is present.  5. The inferior vena cava is normal in size with greater than 50%  respiratory variability, suggesting right atrial pressure of 3 mmHg.    ASSESSMENT AND PLAN:  1.  ***   Current medicines are reviewed at length with the patient today.  I have spent *** dedicated to the care of this patient on the date of this encounter to include pre-visit review of records, assessment, management and diagnostic testing,with shared decision making.  Labs/ tests ordered today include: *** Bettey Mare. Liborio Nixon, ANP, AACC   04/07/2020 5:52 PM    [facility] [address] Office [phone] Fax [phone]  Notice: This dictation was prepared with Dragon dictation along with smaller phrase technology. Any transcriptional errors that result from this process are unintentional and may not be corrected upon review.

## 2020-04-10 ENCOUNTER — Ambulatory Visit: Payer: Medicare HMO | Admitting: Adult Health

## 2020-06-10 DIAGNOSIS — E559 Vitamin D deficiency, unspecified: Secondary | ICD-10-CM | POA: Diagnosis not present

## 2020-06-10 DIAGNOSIS — I4891 Unspecified atrial fibrillation: Secondary | ICD-10-CM | POA: Diagnosis not present

## 2020-06-10 DIAGNOSIS — I63411 Cerebral infarction due to embolism of right middle cerebral artery: Secondary | ICD-10-CM | POA: Diagnosis not present

## 2020-06-10 DIAGNOSIS — E039 Hypothyroidism, unspecified: Secondary | ICD-10-CM | POA: Diagnosis not present

## 2020-06-10 DIAGNOSIS — E7800 Pure hypercholesterolemia, unspecified: Secondary | ICD-10-CM | POA: Diagnosis not present

## 2020-06-10 DIAGNOSIS — R7301 Impaired fasting glucose: Secondary | ICD-10-CM | POA: Diagnosis not present

## 2020-06-10 DIAGNOSIS — R69 Illness, unspecified: Secondary | ICD-10-CM | POA: Diagnosis not present

## 2020-06-10 DIAGNOSIS — I10 Essential (primary) hypertension: Secondary | ICD-10-CM | POA: Diagnosis not present

## 2020-06-10 DIAGNOSIS — K2100 Gastro-esophageal reflux disease with esophagitis, without bleeding: Secondary | ICD-10-CM | POA: Diagnosis not present

## 2020-06-10 DIAGNOSIS — R413 Other amnesia: Secondary | ICD-10-CM | POA: Diagnosis not present

## 2020-06-22 NOTE — Progress Notes (Deleted)
Cardiology Clinic Note   Patient Name: Alyssa Small Date of Encounter: 06/22/2020  Primary Care Provider:  Shellia Cleverly, PA Primary Cardiologist:  Rollene Rotunda, MD  Patient Profile    Alyssa Small 75 year old female presents the clinic today for follow-up evaluation of her essential hypertension and cardioembolic CVA.  Past Medical History    Past Medical History:  Diagnosis Date  . Arthritis   . GERD (gastroesophageal reflux disease)   . Hypertension   . Stroke [facility])    2007 no deficits    No past surgical history on file.  Allergies  Allergies  Allergen Reactions  . Atorvastatin Other (See Comments)    Muscle spasom Muscle spasom   . Zolpidem Other (See Comments)    confusion confusion   . Ceftriaxone Rash    Other reaction(s): Rash Redness  Redness     History of Present Illness    Ms. Gomm has a PMH of arthritis, GERD, hypertension, atrial fibrillation, and cardioembolic CVA 2007 with no deficits.  She was seen by Dr. Antoine Poche on 12/06/2019.  During that time she reported she was moving around slowly due to back pain.  She was using her walker.  She reported she had less energy.  She reported she would fatigue more easily and denied increased shortness of breath.  She was ambulated around the office and her oxygen saturation went to the 80s after started in the low 90s.  She reported she felt relatively well after the ambulation.  She denied PND and orthopnea.  She denied chest pressure arm and neck discomfort.  She reported having difficulty with acid reflux and cough however she felt it was getting better.  She denied lower extremity swelling.  She was not noted to have any residual CVA effects.  She denied palpitations and cardiac awareness of her atrial fibrillation.  She presents the clinic today for follow-up evaluation states***  *** denies chest pain, shortness of breath, lower extremity edema, fatigue, palpitations, melena, hematuria, hemoptysis,  diaphoresis, weakness, presyncope, syncope, orthopnea, and PND.   Home Medications    Prior to Admission medications   Medication Sig Start Date End Date Taking? Authorizing Provider  albuterol (VENTOLIN HFA) 108 (90 Base) MCG/ACT inhaler Inhale 2 puffs into the lungs every 4 (four) hours as needed for shortness of breath. 09/24/19   [provider]  apixaban (ELIQUIS) 5 MG TABS tablet Take 1 tablet (5 mg total) by mouth 2 (two) times daily. 10/12/19   Rinehuls, Kinnie Scales, PA-C  busPIRone (BUSPAR) 10 MG tablet Take 10 mg by mouth 3 (three) times daily as needed. 10/24/19   [provider]  citalopram (CELEXA) 20 MG tablet Take 1 tablet (20 mg total) by mouth daily. 10/13/19   Rinehuls, Kinnie Scales, PA-C  diphenhydrAMINE (BENADRYL) 25 mg capsule Take 25 mg by mouth every 6 (six) hours as needed for itching.    [provider]  famotidine (PEPCID) 40 MG tablet Take 40 mg by mouth daily. 10/24/19   [provider]  fluticasone (FLONASE) 50 MCG/ACT nasal spray Place 1 spray into both nostrils daily as needed for allergies. 01/07/15   [provider]  levothyroxine (SYNTHROID) 75 MCG tablet Take 75 mcg by mouth every morning. 07/12/19   [provider]  metoprolol tartrate (LOPRESSOR) 50 MG tablet Take 1 tablet (50 mg total) by mouth 2 (two) times daily. 10/12/19   Rinehuls, Kinnie Scales, PA-C  pantoprazole (PROTONIX) 40 MG tablet Take 40 mg by mouth daily. 10/24/19  [provider]  simvastatin (ZOCOR) 40 MG tablet Take 1 tablet (40 mg total) by mouth at bedtime. 10/12/19   Rinehuls, Kinnie Scales, PA-C  traZODone (DESYREL) 50 MG tablet Take 50 mg by mouth at bedtime. 08/29/19   [provider]  Vitamin D, Ergocalciferol, (DRISDOL) 1.25 MG (50000 UNIT) CAPS capsule Take 50,000 Units by mouth every 14 (fourteen) days. 08/24/19   [provider]    Family History    Family History  Problem Relation Age of Onset  . Hypertension Mother   .  Hypertension Father    She indicated that the status of her mother is unknown. She indicated that the status of her father is unknown.  Social History    Social History   Socioeconomic History  . Marital status: Unknown    Spouse name: Not on file  . Number of children: Not on file  . Years of education: Not on file  . Highest education level: Not on file  Occupational History  . Not on file  Tobacco Use  . Smoking status: Never Smoker  . Smokeless tobacco: Never Used  Substance and Sexual Activity  . Alcohol use: Never  . Drug use: Never  . Sexual activity: Not on file  Other Topics Concern  . Not on file  Social History Narrative  . Not on file   Social Determinants of Health   Financial Resource Strain: Not on file  Food Insecurity: Not on file  Transportation Needs: Not on file  Physical Activity: Not on file  Stress: Not on file  Social Connections: Not on file  Intimate Partner Violence: Not on file     Review of Systems    General:  No chills, fever, night sweats or weight changes.  Cardiovascular:  No chest pain, dyspnea on exertion, edema, orthopnea, palpitations, paroxysmal nocturnal dyspnea. Dermatological: No rash, lesions/masses Respiratory: No cough, dyspnea Urologic: No hematuria, dysuria Abdominal:   No nausea, vomiting, diarrhea, bright red blood per rectum, melena, or hematemesis Neurologic:  No visual changes, wkns, changes in mental status. All other systems reviewed and are otherwise negative except as noted above.  Physical Exam    VS:  There were no vitals taken for this visit. , BMI There is no height or weight on file to calculate BMI. GEN: Well nourished, well developed, in no acute distress. HEENT: normal. Neck: Supple, no JVD, carotid bruits, or masses. Cardiac: RRR, no murmurs, rubs, or gallops. No clubbing, cyanosis, edema.  Radials/DP/PT 2+ and equal bilaterally.  Respiratory:  Respirations regular and unlabored, clear to  auscultation bilaterally. GI: Soft, nontender, nondistended, BS + x 4. MS: no deformity or atrophy. Skin: warm and dry, no rash. Neuro:  Strength and sensation are intact. Psych: Normal affect.  Accessory Clinical Findings    Recent Labs: 10/07/2019: ALT 12 10/09/2019: TSH 1.729 10/11/2019: Magnesium 1.5 11/05/2019: BUN 11; Creatinine, Ser 0.78; Hemoglobin 13.4; Platelets 369; Potassium 3.9; Sodium 139   Recent Lipid Panel    Component Value Date/Time   CHOL 171 10/08/2019 0433   TRIG 43 10/08/2019 0433   HDL 81 10/08/2019 0433   CHOLHDL 2.1 10/08/2019 0433   VLDL 9 10/08/2019 0433   LDLCALC 81 10/08/2019 0433    ECG personally reviewed by me today- *** - No acute changes  Echocardiogram 10/08/2019 IMPRESSIONS    1. Left ventricular ejection fraction, by estimation, is 60 to 65%. The  left ventricle has normal function. The left ventricle has no regional  wall motion abnormalities.  Left ventricular diastolic parameters are  consistent with Grade II diastolic  dysfunction (pseudonormalization). Elevated left atrial pressure.  2. Right ventricular systolic function is normal. The right ventricular  size is normal.  3. The mitral valve is normal in structure. No evidence of mitral valve  regurgitation. No evidence of mitral stenosis.  4. The aortic valve is normal in structure. Aortic valve regurgitation is  not visualized. No aortic stenosis is present.  5. The inferior vena cava is normal in size with greater than 50%  respiratory variability, suggesting right atrial pressure of 3 mmHg.   Assessment & Plan   1.  Atrial fibrillation- remains cardiac unaware.  CHA2DS2-VASc score 6 on anticoagulation.  Denies bleeding issues. Continue apixaban, metoprolol Heart healthy low-sodium diet-salty 6 given Increase physical activity as tolerated Follows with [facility]  Essential hypertension-BP today***.  Well-controlled at home. Continue metoprolol Heart healthy  low-sodium diet Increase physical activity as tolerated  Cardioembolic CVA- no deficits.  Denies recent neurological symptoms. Continue apixaban, simvastatin Heart healthy low-sodium diet-salty 6 given Increase physical activity as tolerated  Hyperlipidemia-10/08/2019: Cholesterol 171; HDL 81; LDL Cholesterol 81; Triglycerides 43; VLDL 9 Continue simvastatin, Heart healthy low-sodium high-fiber diet Increase physical activity as tolerated  Hypoxemia- asymptomatic.  Previously completed in office ambulation with desaturation but denied symptoms. Overnight pulse oximetry result not available. Will confer with Dr. Antoine Poche Continue to monitor.  Disposition: Follow-up with Dr. Antoine Poche in 6 months.   Thomasene Ripple. Elleen Coulibaly NP-C    06/22/2020, 2:21 PM [facility] [address] Office [phone] Fax [phone]  Notice: This dictation was prepared with Dragon dictation along with smaller phrase technology. Any transcriptional errors that result from this process are unintentional and may not be corrected upon review.  I spent***minutes examining this patient, reviewing medications, and using patient centered shared decision making involving her cardiac care.  Prior to her visit I spent greater than 20 minutes reviewing her past medical history,  medications, and prior cardiac tests.

## 2020-06-23 ENCOUNTER — Ambulatory Visit: Payer: Medicare HMO | Admitting: General Practice

## 2020-07-08 NOTE — Progress Notes (Signed)
Cardiology Clinic Note   Patient Name: Alyssa Small Date of Encounter: 07/10/2020  Primary Care Provider:  Shellia Cleverly, PA Primary Cardiologist:  Alyssa Rotunda, MD  Patient Profile    Alyssa Small 75 year old female presents to clinic today for follow-up evaluation of her paroxysmal atrial fibrillation cardioembolic stroke, and essential hypertension.  Past Medical History    Past Medical History:  Diagnosis Date  . Arthritis   . GERD (gastroesophageal reflux disease)   . Hypertension   . Stroke [facility])    2007 no deficits    History reviewed. No pertinent surgical history.  Allergies  Allergies  Allergen Reactions  . Atorvastatin Other (See Comments)    Muscle spasom Muscle spasom   . Zolpidem Other (See Comments)    confusion confusion   . Ceftriaxone Rash    Other reaction(s): Rash Redness  Redness     History of Present Illness    Ms. Steinhaus has a PMH of atrial fibrillation, cardioembolic stroke, hypertension, PVCs, hypoxemia, and shortness of breath.  She was seen by Dr. Antoine Small on 12/06/2019.  During that time she reported she was getting around okay at home.  She was moving slowly due to back pain.  She was using a walker.  She reported that she had less energy.  She also reported that she was fatiguing more easily.  She denied increased shortness of breath when doing activities such as walking.  She was ambulated around the office and her oxygen saturation dropped into the 80s and started in the 90% range however, she felt relatively well.  She denied orthopnea and PND.  She denied chest pain, neck and arm discomfort.  She did report trouble with acid reflux and reported a cough that was getting better.  She denied lower extremity swelling.  She was not noted to have residual effects from her CVA.  She denied palpitations.  She is cardiac unaware.  She presents the clinic today for follow-up evaluation states she feels well.  She does not note any further  episodes of accelerated heartbeat or irregular heartbeats.  She remains very physically active making her own meals, cleaning, walking several times a day, and generally staying active with her walker.  She is somewhat limited by her arthritis in her hips.  She does not monitor her blood pressure at home but it is well controlled in the office today.  She asks about triggers for atrial fibrillation and I reviewed them with her daughter as well.  I will have her maintain her physical activity, maintain a blood pressure log, give triggers for atrial fibrillation, and have her follow-up in 6 months.  Today she denies chest pain, shortness of breath, lower extremity edema, fatigue, palpitations, melena, hematuria, hemoptysis, diaphoresis, weakness, presyncope, syncope, orthopnea, and PND.   Home Medications    Prior to Admission medications   Medication Sig Start Date End Date Taking? Authorizing Provider  albuterol (VENTOLIN HFA) 108 (90 Base) MCG/ACT inhaler Inhale 2 puffs into the lungs every 4 (four) hours as needed for shortness of breath. 09/24/19   [provider]  apixaban (ELIQUIS) 5 MG TABS tablet Take 1 tablet (5 mg total) by mouth 2 (two) times daily. 10/12/19   Rinehuls, Kinnie Scales, PA-C  busPIRone (BUSPAR) 10 MG tablet Take 10 mg by mouth 3 (three) times daily as needed. 10/24/19   [provider]  citalopram (CELEXA) 20 MG tablet Take 1 tablet (20 mg total) by mouth daily. 10/13/19   Rinehuls, Kinnie Scales,  PA-C  diphenhydrAMINE (BENADRYL) 25 mg capsule Take 25 mg by mouth every 6 (six) hours as needed for itching.    [provider]  famotidine (PEPCID) 40 MG tablet Take 40 mg by mouth daily. 10/24/19   [provider]  fluticasone (FLONASE) 50 MCG/ACT nasal spray Place 1 spray into both nostrils daily as needed for allergies. 01/07/15   [provider]  levothyroxine (SYNTHROID) 75 MCG tablet Take 75 mcg by mouth every morning. 07/12/19   [provider]  metoprolol tartrate (LOPRESSOR) 50 MG tablet Take 1 tablet (50 mg total) by mouth 2 (two) times daily. 10/12/19   Rinehuls, Kinnie Scales, PA-C  pantoprazole (PROTONIX) 40 MG tablet Take 40 mg by mouth daily. 10/24/19   [provider]  simvastatin (ZOCOR) 40 MG tablet Take 1 tablet (40 mg total) by mouth at bedtime. 10/12/19   Rinehuls, Kinnie Scales, PA-C  traZODone (DESYREL) 50 MG tablet Take 50 mg by mouth at bedtime. 08/29/19   [provider]  Vitamin D, Ergocalciferol, (DRISDOL) 1.25 MG (50000 UNIT) CAPS capsule Take 50,000 Units by mouth every 14 (fourteen) days. 08/24/19   [provider]    Family History    Family History  Problem Relation Age of Onset  . Hypertension Mother   . Hypertension Father    She indicated that the status of her mother is unknown. She indicated that the status of her father is unknown.  Social History    Social History   Socioeconomic History  . Marital status: Unknown    Spouse name: Not on file  . Number of children: Not on file  . Years of education: Not on file  . Highest education level: Not on file  Occupational History  . Not on file  Tobacco Use  . Smoking status: Never Smoker  . Smokeless tobacco: Never Used  Substance and Sexual Activity  . Alcohol use: Never  . Drug use: Never  . Sexual activity: Not on file  Other Topics Concern  . Not on file  Social History Narrative  . Not on file   Social Determinants of Health   Financial Resource Strain: Not on file  Food Insecurity: Not on file  Transportation Needs: Not on file  Physical Activity: Not on file  Stress: Not on file  Social Connections: Not on file  Intimate Partner Violence: Not on file     Review of Systems    General:  No chills, fever, night sweats or weight changes.  Cardiovascular:  No chest pain, dyspnea on exertion, edema, orthopnea, palpitations, paroxysmal nocturnal dyspnea. Dermatological: No rash,  lesions/masses Respiratory: No cough, dyspnea Urologic: No hematuria, dysuria Abdominal:   No nausea, vomiting, diarrhea, bright red blood per rectum, melena, or hematemesis Neurologic:  No visual changes, wkns, changes in mental status. All other systems reviewed and are otherwise negative except as noted above.  Physical Exam    VS:  BP 120/80 (BP Location: Left Arm)   Pulse 63   Ht 5\' 6"  (1.676 m)   Wt 171 lb (77.6 kg)   BMI 27.60 kg/m  , BMI Body mass index is 27.6 kg/m. GEN: Well nourished, well developed, in no acute distress. HEENT: normal. Neck: Supple, no JVD, carotid bruits, or masses. Cardiac: RRR, no murmurs, rubs, or gallops. No clubbing, cyanosis, edema.  Radials/DP/PT 2+ and equal bilaterally.  Respiratory:  Respirations regular and unlabored, clear to auscultation bilaterally. GI: Soft, nontender, nondistended, BS + x 4. MS: no deformity or  atrophy. Skin: warm and dry, no rash. Neuro:  Strength and sensation are intact. Psych: Normal affect.  Accessory Clinical Findings    Recent Labs: 10/07/2019: ALT 12 10/09/2019: TSH 1.729 10/11/2019: Magnesium 1.5 11/05/2019: BUN 11; Creatinine, Ser 0.78; Hemoglobin 13.4; Platelets 369; Potassium 3.9; Sodium 139   Recent Lipid Panel    Component Value Date/Time   CHOL 171 10/08/2019 0433   TRIG 43 10/08/2019 0433   HDL 81 10/08/2019 0433   CHOLHDL 2.1 10/08/2019 0433   VLDL 9 10/08/2019 0433   LDLCALC 81 10/08/2019 0433    ECG personally reviewed by me today-sinus rhythm with possible premature atrial complexes with aberrant conduction low voltage QRS cannot rule out anterior infarct undetermined age 86 bpm  Echocardiogram 10/08/2019 IMPRESSIONS    1. Left ventricular ejection fraction, by estimation, is 60 to 65%. The  left ventricle has normal function. The left ventricle has no regional  wall motion abnormalities. Left ventricular diastolic parameters are  consistent with Grade II diastolic  dysfunction  (pseudonormalization). Elevated left atrial pressure.  2. Right ventricular systolic function is normal. The right ventricular  size is normal.  3. The mitral valve is normal in structure. No evidence of mitral valve  regurgitation. No evidence of mitral stenosis.  4. The aortic valve is normal in structure. Aortic valve regurgitation is  not visualized. No aortic stenosis is present.  5. The inferior vena cava is normal in size with greater than 50%  respiratory variability, suggesting right atrial pressure of 3 mmHg.  Assessment & Plan   1. .  Atrial fibrillation- remains cardiac unaware.  CHA2DS2-VASc score 6 on anticoagulation.  Denies bleeding issues. Continue apixaban, metoprolol Heart healthy low-sodium diet-salty 6 given Increase physical activity as tolerated Follows with [facility] fib clinic  Essential hypertension-BP today 120/80.  Well-controlled at home. Continue metoprolol Heart healthy low-sodium diet Increase physical activity as tolerated  Cardioembolic CVA- no deficits.  Denies recent neurological symptoms. Continue apixaban, simvastatin Heart healthy low-sodium diet-salty 6 given Increase physical activity as tolerated  Hyperlipidemia-10/08/2019: Cholesterol 171; HDL 81; LDL Cholesterol 81; Triglycerides 43; VLDL 9 Continue simvastatin, Heart healthy low-sodium high-fiber diet Increase physical activity as tolerated  Hypoxemia- asymptomatic.  Previously completed in office ambulation with desaturation but denied symptoms. Overnight pulse oximetry result not available. Will confer with Dr. Antoine Small Continue to monitor.  Disposition: Follow-up with Dr. Antoine Small in 6 months.  Thomasene Ripple. Atwell Mcdanel NP-C    07/10/2020, 12:43 PM [facility] [address] Office [phone] Fax [phone]  Notice: This dictation was prepared with Dragon dictation along with smaller phrase technology. Any transcriptional errors that result  from this process are unintentional and may not be corrected upon review.  I spent 15 minutes examining this patient, reviewing medications, and using patient centered shared decision making involving her cardiac care.  Prior to her visit I spent greater than 20 minutes reviewing her past medical history,  medications, and prior cardiac tests.

## 2020-07-10 ENCOUNTER — Other Ambulatory Visit: Payer: Self-pay

## 2020-07-10 ENCOUNTER — Encounter: Payer: Self-pay | Admitting: General Practice

## 2020-07-10 ENCOUNTER — Ambulatory Visit: Payer: Medicare HMO | Admitting: General Practice

## 2020-07-10 VITALS — BP 120/80 | HR 63 | Ht 66.0 in | Wt 171.0 lb

## 2020-07-10 DIAGNOSIS — I480 Paroxysmal atrial fibrillation: Secondary | ICD-10-CM | POA: Diagnosis not present

## 2020-07-10 DIAGNOSIS — I10 Essential (primary) hypertension: Secondary | ICD-10-CM

## 2020-07-10 DIAGNOSIS — R0902 Hypoxemia: Secondary | ICD-10-CM | POA: Diagnosis not present

## 2020-07-10 DIAGNOSIS — D6869 Other thrombophilia: Secondary | ICD-10-CM | POA: Diagnosis not present

## 2020-07-10 DIAGNOSIS — I639 Cerebral infarction, unspecified: Secondary | ICD-10-CM | POA: Diagnosis not present

## 2020-07-10 NOTE — Patient Instructions (Signed)
Medication Instructions:  The current medical regimen is effective;  continue present plan and medications as directed. Please refer to the Current Medication list given to you today.  *If you need a refill on your cardiac medications before your next appointment, please call your pharmacy*  Lab Work:   Testing/Procedures:  NONE    NONE  Special Instructions PLEASE READ AND FOLLOW SALTY 6-ATTACHED-1,800mg  daily  PLEASE MAINTAIN PHYSICAL ACTIVITY AS TOLERATED  Please try to avoid these triggers:  Do not use any products that have nicotine or tobacco in them. These include cigarettes, e-cigarettes, and chewing tobacco. If you need help quitting, ask your doctor.  Eat heart-healthy foods. Talk with your doctor about the right eating plan for you.  Exercise regularly as told by your doctor.  Stay hydrated  Do not drink alcohol, Caffeine or chocolate.  Lose weight if you are overweight.  Do not use drugs, including cannabis   Follow-Up: Your next appointment:  6 month(s) In Person with Rollene Rotunda, MD OR IF UNAVAILABLE JESSE CLEAVER, FNP-C   Please call our office 2 months in advance to schedule this appointment   At [facility], you and your health needs are our priority.  As part of our continuing mission to provide you with exceptional heart care, we have created designated Provider Care Teams.  These Care Teams include your primary Cardiologist (physician) and Advanced Practice Providers (APPs -  Physician Assistants and Nurse Practitioners) who all work together to provide you with the care you need, when you need it.            6 SALTY THINGS TO AVOID     1,800MG  DAILY

## 2020-08-26 DIAGNOSIS — R413 Other amnesia: Secondary | ICD-10-CM | POA: Diagnosis not present

## 2020-08-26 DIAGNOSIS — Z8673 Personal history of transient ischemic attack (TIA), and cerebral infarction without residual deficits: Secondary | ICD-10-CM | POA: Diagnosis not present

## 2020-10-01 MED ORDER — APIXABAN 5 MG PO TABS: 5.0000 mg | ORAL_TABLET | Freq: Two times a day (BID) | ORAL | 2 refills | Status: DC

## 2021-01-11 DIAGNOSIS — E785 Hyperlipidemia, unspecified: Secondary | ICD-10-CM | POA: Insufficient documentation

## 2021-01-11 DIAGNOSIS — I482 Chronic atrial fibrillation, unspecified: Secondary | ICD-10-CM | POA: Insufficient documentation

## 2021-01-11 NOTE — Progress Notes (Signed)
Cardiology Office Note   Date:  01/12/2021   ID:  Alyssa Small, DOB 1945-10-30, MRN [id number]  PCP:  Shellia Cleverly, PA  Cardiologist:   Rollene Rotunda, MD   Chief Complaint  Patient presents with   Atrial Fibrillation      History of Present Illness: Alyssa Small is a 75 y.o. female who presents for follow up of atrial fib.  She is done relatively well since I saw her.  She did not really have a lot of residual from her stroke.  She has back problems.  She does get around in a wheelchair if she has to go long distances but she gets around in the house pretty well.  She has a lift chair from her basement.  She does not really notice the palpitations.  Has not had presyncope or syncope.  She denies any chest pain.  She is had no new shortness of breath, PND or orthopnea.  She has been really watching what she has been eating so she is lost about 26 pounds.   Past Medical History:  Diagnosis Date   Arthritis    GERD (gastroesophageal reflux disease)    Hypertension    Stroke (HCC)    2007 no deficits     History reviewed. No pertinent surgical history.   Current Outpatient Medications  Medication Sig Dispense Refill   albuterol (VENTOLIN HFA) 108 (90 Base) MCG/ACT inhaler Inhale 2 puffs into the lungs every 4 (four) hours as needed for shortness of breath.     apixaban (ELIQUIS) 5 MG TABS tablet Take 1 tablet (5 mg total) by mouth 2 (two) times daily. 60 tablet 2   busPIRone (BUSPAR) 10 MG tablet Take 10 mg by mouth 3 (three) times daily as needed.     citalopram (CELEXA) 20 MG tablet Take 1 tablet (20 mg total) by mouth daily. 30 tablet 2   diphenhydrAMINE (BENADRYL) 25 mg capsule Take 25 mg by mouth every 6 (six) hours as needed for itching.     famotidine (PEPCID) 40 MG tablet Take 40 mg by mouth daily.     fluticasone (FLONASE) 50 MCG/ACT nasal spray Place 1 spray into both nostrils daily as needed for allergies.     levothyroxine (SYNTHROID) 75 MCG tablet Take 75 mcg  by mouth every morning.     metoprolol tartrate (LOPRESSOR) 50 MG tablet Take 1 tablet (50 mg total) by mouth 2 (two) times daily. 60 tablet 2   pantoprazole (PROTONIX) 40 MG tablet Take 40 mg by mouth daily.     simvastatin (ZOCOR) 40 MG tablet Take 1 tablet (40 mg total) by mouth at bedtime. 30 tablet 2   traZODone (DESYREL) 50 MG tablet Take 50 mg by mouth at bedtime.     Vitamin D, Ergocalciferol, (DRISDOL) 1.25 MG (50000 UNIT) CAPS capsule Take 50,000 Units by mouth every 14 (fourteen) days.     No current facility-administered medications for this visit.    Allergies:   Atorvastatin, Zolpidem, and Ceftriaxone    ROS:  Please see the history of present illness.   Otherwise, review of systems are positive for none.   All other systems are reviewed and negative.    PHYSICAL EXAM: VS:  BP 136/87 (BP Location: Left Arm)   Pulse 75   Ht 5\' 6"  (1.676 m)   Wt 163 lb 12.8 oz (74.3 kg)   SpO2 99%   BMI 26.44 kg/m  , BMI Body mass index is 26.44 kg/m. GENERAL:  Well appearing NECK:  No jugular venous distention, waveform within normal limits, carotid upstroke brisk and symmetric, no bruits, no thyromegaly LUNGS:  Clear to auscultation bilaterally CHEST:  Unremarkable HEART:  PMI not displaced or sustained,S1 and S2 within normal limits, no S3,  no clicks, no rubs, no murmurs, irregular  ABD:  Flat, positive bowel sounds normal in frequency in pitch, no bruits, no rebound, no guarding, no midline pulsatile mass, no hepatomegaly, no splenomegaly EXT:  2 plus pulses throughout, no edema, no cyanosis no clubbing  EKG:  EKG is  ordered today. Atrial fibrillation, rate 75, axis within normal limits, intervals within normal limits, no acute ST-T wave changes.  Low voltage, no acute change from previous.   Recent Labs: No results found for requested labs within last 8760 hours.    Lipid Panel    Component Value Date/Time   CHOL 171 10/08/2019 0433   TRIG 43 10/08/2019 0433   HDL 81  10/08/2019 0433   CHOLHDL 2.1 10/08/2019 0433   VLDL 9 10/08/2019 0433   LDLCALC 81 10/08/2019 0433      Wt Readings from Last 3 Encounters:  01/12/21 163 lb 12.8 oz (74.3 kg)  07/10/20 171 lb (77.6 kg)  12/06/19 173 lb (78.5 kg)      Other studies Reviewed: Additional studies/ records that were reviewed today include: Labs. Review of the above records demonstrates:  Please see elsewhere in the note.     ASSESSMENT AND PLAN:  Recurrent stroke, likely cardioembolic:    She tolerates anticoagulation.  No change in therapy.   Atrial fibrillation:    [name] has a CHA2DS2 - VASc score of 6.  She continues with anticoagulation and she is on the appropriate dose.  She has blood work due in January.   Essential HTN: The blood pressure is well controlled.  No change in therapy.    Current medicines are reviewed at length with the patient today.  The patient does not have concerns regarding medicines.  The following changes have been made: None  Labs/ tests ordered today include: Overnight pulse oximetry  Orders Placed This Encounter  Procedures   EKG 12-Lead      Disposition:   FU with me 1 year   Signed, Minus Breeding, MD  01/12/2021 4:30 PM    [place]

## 2021-01-12 ENCOUNTER — Encounter: Payer: Self-pay | Admitting: Cardiology

## 2021-01-12 ENCOUNTER — Ambulatory Visit: Payer: Medicare HMO | Admitting: Cardiology

## 2021-01-12 ENCOUNTER — Other Ambulatory Visit: Payer: Self-pay

## 2021-01-12 VITALS — BP 136/87 | HR 75 | Ht 66.0 in | Wt 163.8 lb

## 2021-01-12 DIAGNOSIS — I482 Chronic atrial fibrillation, unspecified: Secondary | ICD-10-CM

## 2021-01-12 DIAGNOSIS — E785 Hyperlipidemia, unspecified: Secondary | ICD-10-CM

## 2021-01-12 DIAGNOSIS — R0902 Hypoxemia: Secondary | ICD-10-CM | POA: Diagnosis not present

## 2021-01-12 DIAGNOSIS — I639 Cerebral infarction, unspecified: Secondary | ICD-10-CM

## 2021-01-12 DIAGNOSIS — I10 Essential (primary) hypertension: Secondary | ICD-10-CM | POA: Diagnosis not present

## 2021-01-12 NOTE — Patient Instructions (Signed)

## 2021-03-16 DIAGNOSIS — Z7901 Long term (current) use of anticoagulants: Secondary | ICD-10-CM | POA: Diagnosis not present

## 2021-03-16 DIAGNOSIS — Z0000 Encounter for general adult medical examination without abnormal findings: Secondary | ICD-10-CM | POA: Diagnosis not present

## 2021-03-16 DIAGNOSIS — I4891 Unspecified atrial fibrillation: Secondary | ICD-10-CM | POA: Diagnosis not present

## 2021-03-16 DIAGNOSIS — Z79899 Other long term (current) drug therapy: Secondary | ICD-10-CM | POA: Diagnosis not present

## 2021-03-16 DIAGNOSIS — E039 Hypothyroidism, unspecified: Secondary | ICD-10-CM | POA: Diagnosis not present

## 2021-03-16 DIAGNOSIS — Z823 Family history of stroke: Secondary | ICD-10-CM | POA: Diagnosis not present

## 2021-03-16 DIAGNOSIS — R7301 Impaired fasting glucose: Secondary | ICD-10-CM | POA: Diagnosis not present

## 2021-03-16 DIAGNOSIS — I10 Essential (primary) hypertension: Secondary | ICD-10-CM | POA: Diagnosis not present

## 2021-03-16 DIAGNOSIS — E7800 Pure hypercholesterolemia, unspecified: Secondary | ICD-10-CM | POA: Diagnosis not present

## 2021-03-16 DIAGNOSIS — E559 Vitamin D deficiency, unspecified: Secondary | ICD-10-CM | POA: Diagnosis not present

## 2021-04-02 ENCOUNTER — Other Ambulatory Visit: Payer: Self-pay | Admitting: Cardiology

## 2021-04-02 NOTE — Telephone Encounter (Signed)
Prescription refill request for Eliquis received. Indication:Afib Last office visit:11/22 Scr:0.7 Age: 76 Weight:74.3 kg  Prescription refilled

## 2021-08-11 DIAGNOSIS — H52221 Regular astigmatism, right eye: Secondary | ICD-10-CM | POA: Diagnosis not present

## 2021-08-22 IMAGING — CR DG CHEST 2V
2 series · 2 of 2 positions shown · non-contrast
Comparison: 11/02/2014

CLINICAL DATA: Stroke.

EXAM:
CHEST - 2 VIEW

[chest lat]
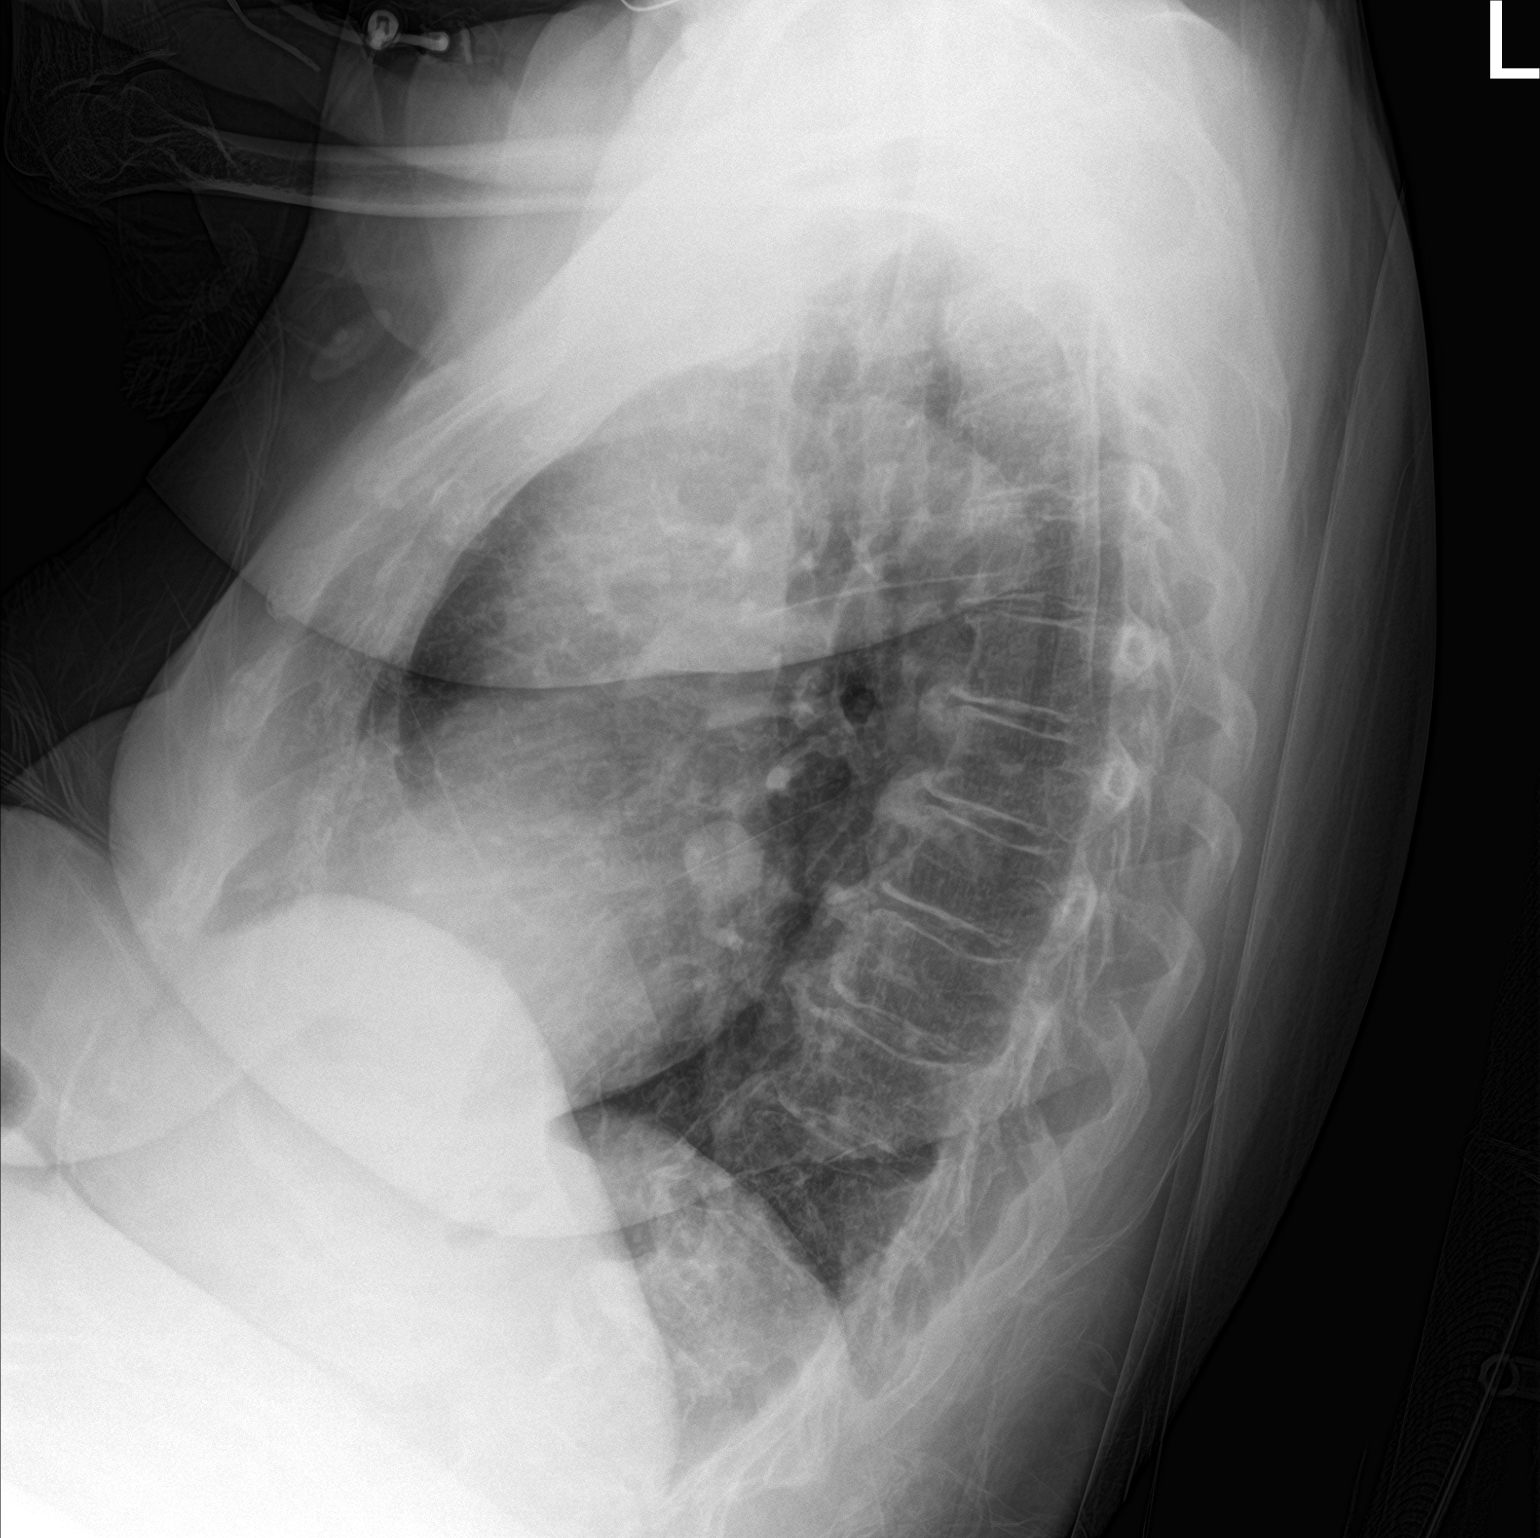

[chest ap]
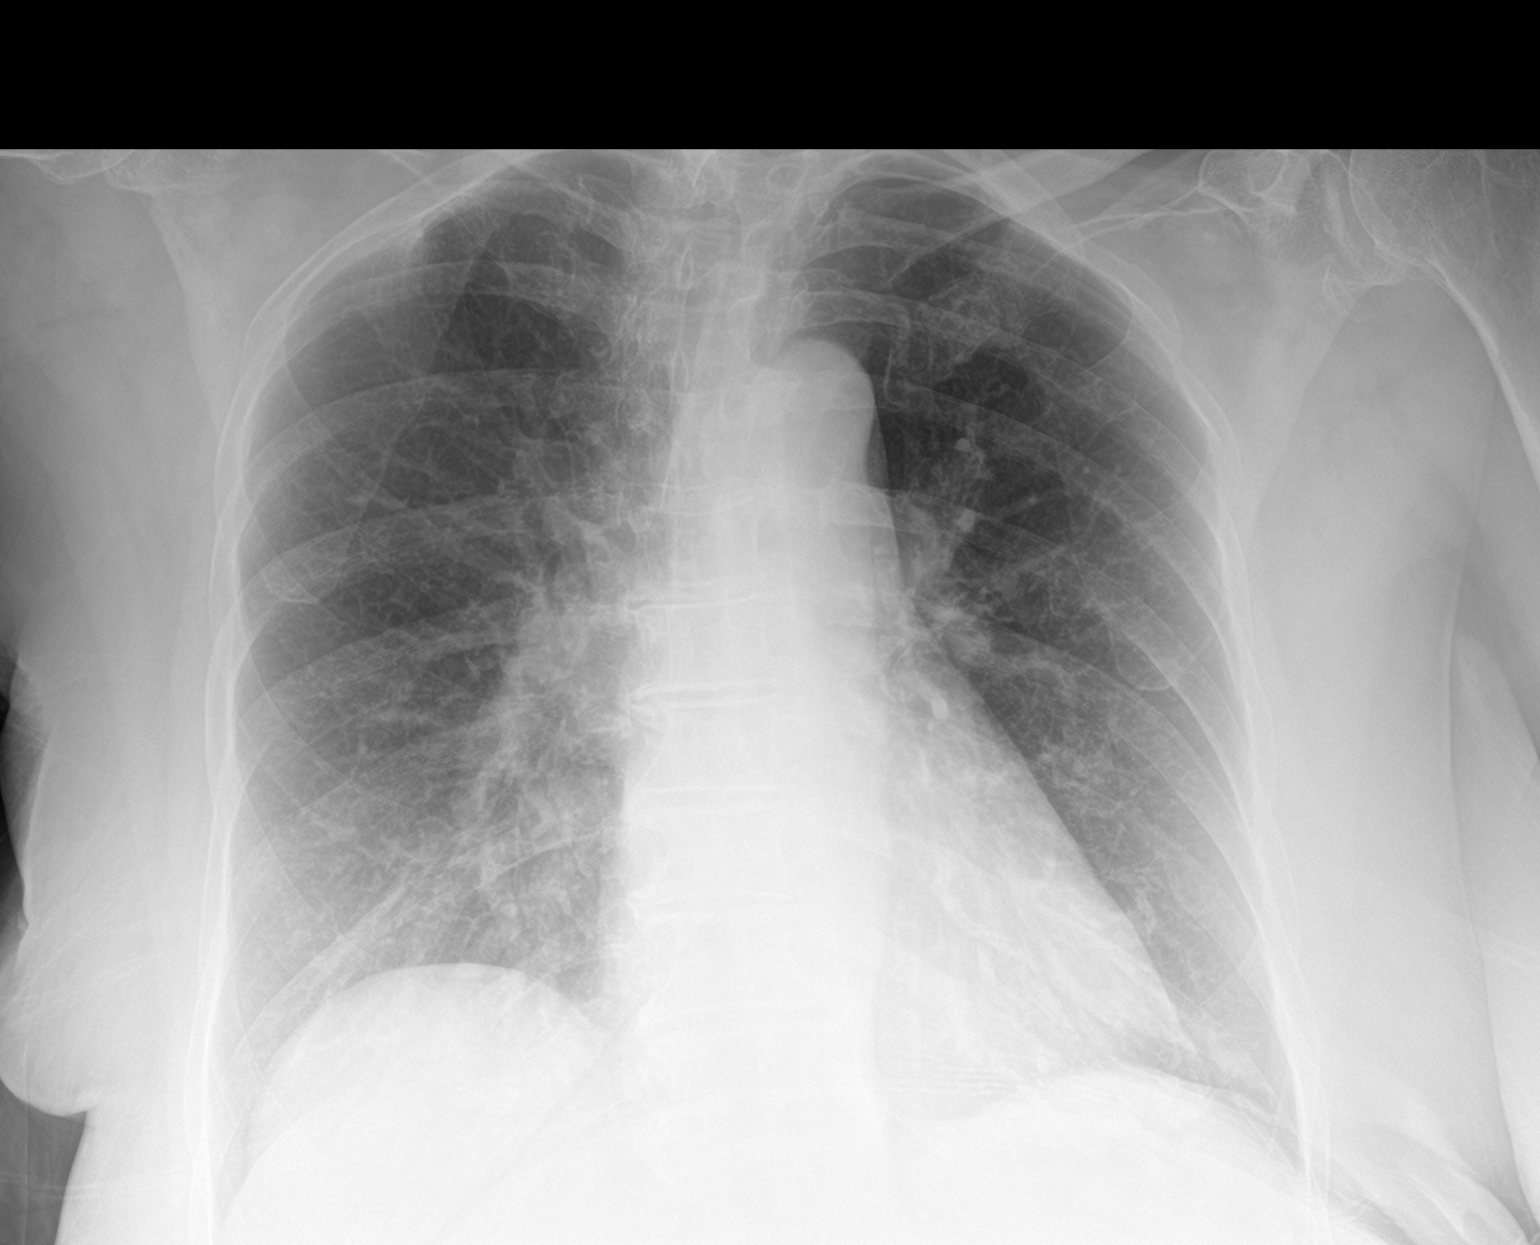

[2 of 2 positions shown; findings below may reference images not displayed]

FINDINGS: Normal sized heart. Clear lungs with normal vascularity. Thoracic
spine degenerative changes. Diffuse osteopenia.
IMPRESSION: No acute abnormality.

## 2021-09-13 DIAGNOSIS — I10 Essential (primary) hypertension: Secondary | ICD-10-CM | POA: Diagnosis not present

## 2021-09-13 DIAGNOSIS — R69 Illness, unspecified: Secondary | ICD-10-CM | POA: Diagnosis not present

## 2021-09-13 DIAGNOSIS — E7800 Pure hypercholesterolemia, unspecified: Secondary | ICD-10-CM | POA: Diagnosis not present

## 2021-09-13 DIAGNOSIS — E559 Vitamin D deficiency, unspecified: Secondary | ICD-10-CM | POA: Diagnosis not present

## 2021-09-13 DIAGNOSIS — E039 Hypothyroidism, unspecified: Secondary | ICD-10-CM | POA: Diagnosis not present

## 2021-09-13 DIAGNOSIS — R7301 Impaired fasting glucose: Secondary | ICD-10-CM | POA: Diagnosis not present

## 2021-09-13 DIAGNOSIS — I4891 Unspecified atrial fibrillation: Secondary | ICD-10-CM | POA: Diagnosis not present

## 2021-10-03 ENCOUNTER — Other Ambulatory Visit: Payer: Self-pay | Admitting: Cardiology

## 2021-10-03 DIAGNOSIS — I4891 Unspecified atrial fibrillation: Secondary | ICD-10-CM

## 2021-10-04 NOTE — Telephone Encounter (Signed)
Prescription refill request for Eliquis received. Indication: Afib  Last office visit:01/12/21 ([place])  Scr: 0.90 (09/13/21)  Age: 76 Weight: 74.3kg  Appropriate dose and refill sent to requested pharmacy.

## 2021-10-26 ENCOUNTER — Telehealth: Payer: Self-pay | Admitting: *Deleted

## 2021-10-26 DIAGNOSIS — H25013 Cortical age-related cataract, bilateral: Secondary | ICD-10-CM | POA: Diagnosis not present

## 2021-10-26 DIAGNOSIS — H2512 Age-related nuclear cataract, left eye: Secondary | ICD-10-CM | POA: Diagnosis not present

## 2021-10-26 DIAGNOSIS — H25043 Posterior subcapsular polar age-related cataract, bilateral: Secondary | ICD-10-CM | POA: Diagnosis not present

## 2021-10-26 DIAGNOSIS — H02831 Dermatochalasis of right upper eyelid: Secondary | ICD-10-CM | POA: Diagnosis not present

## 2021-10-26 DIAGNOSIS — H2513 Age-related nuclear cataract, bilateral: Secondary | ICD-10-CM | POA: Diagnosis not present

## 2021-10-26 NOTE — Telephone Encounter (Signed)
   Pre-operative Risk Assessment    Patient Name: Alyssa Small  DOB: Mar 29, 1945 MRN: [id number]    Request for Surgical Clearance     Procedure:   CATARACT EXTRACTION WITH INTRAOCULAR LENS IMPLANT OF THE LEFT EYE FOLLOWED BY THE RIGHT EYE  Date of Surgery:  Clearance 01/17/22                                 Surgeon:  DR. Mia Creek Surgeon's Group or Practice Name:  [facility] Phone number:  [phone] Fax number:  [phone]   Type of Clearance Requested:   - Medical ; PER CLEARANCE REQUEST PT DOES NOT NEED TO HOLD ANY MEDICATIONS, INCLUDING ANY BLOOD THINNERS   Type of Anesthesia:   TOPICAL ANESTHESIA WITH IV MEDICATION   Additional requests/questions:    Elpidio Anis   10/26/2021, 5:20 PM

## 2021-11-02 NOTE — Telephone Encounter (Signed)
   Patient Name: Alyssa Small  DOB: 1945/08/26 MRN: [id number]  Primary Cardiologist: Rollene Rotunda, MD  Chart reviewed as part of pre-operative protocol coverage. Cataract extractions are recognized in guidelines as low risk surgeries that do not typically require specific preoperative testing or holding of blood thinner therapy. Therefore, given past medical history and time since last visit, based on ACC/AHA guidelines, Alyssa Small would be at acceptable risk for the planned procedure without further cardiovascular testing.   I will route this recommendation to the requesting party via Epic fax function and remove from pre-op pool.  Please call with questions.  Levi Aland, NP-C    11/02/2021, 1:28 PM [address], [address] Office [phone] Fax [phone]

## 2021-12-01 DIAGNOSIS — R5383 Other fatigue: Secondary | ICD-10-CM | POA: Diagnosis not present

## 2021-12-01 DIAGNOSIS — I10 Essential (primary) hypertension: Secondary | ICD-10-CM | POA: Diagnosis not present

## 2021-12-01 DIAGNOSIS — E559 Vitamin D deficiency, unspecified: Secondary | ICD-10-CM | POA: Diagnosis not present

## 2021-12-01 DIAGNOSIS — I4891 Unspecified atrial fibrillation: Secondary | ICD-10-CM | POA: Diagnosis not present

## 2021-12-01 DIAGNOSIS — E039 Hypothyroidism, unspecified: Secondary | ICD-10-CM | POA: Diagnosis not present

## 2022-01-17 DIAGNOSIS — H2512 Age-related nuclear cataract, left eye: Secondary | ICD-10-CM | POA: Diagnosis not present

## 2022-01-18 DIAGNOSIS — H25011 Cortical age-related cataract, right eye: Secondary | ICD-10-CM | POA: Diagnosis not present

## 2022-01-18 DIAGNOSIS — H2511 Age-related nuclear cataract, right eye: Secondary | ICD-10-CM | POA: Diagnosis not present

## 2022-01-18 DIAGNOSIS — H25041 Posterior subcapsular polar age-related cataract, right eye: Secondary | ICD-10-CM | POA: Diagnosis not present

## 2022-01-27 DIAGNOSIS — I4891 Unspecified atrial fibrillation: Secondary | ICD-10-CM | POA: Diagnosis not present

## 2022-02-07 DIAGNOSIS — H2511 Age-related nuclear cataract, right eye: Secondary | ICD-10-CM | POA: Diagnosis not present

## 2023-04-22 ENCOUNTER — Emergency Department (HOSPITAL_COMMUNITY): Payer: Medicare HMO

## 2023-04-22 ENCOUNTER — Emergency Department (HOSPITAL_COMMUNITY)
Admission: EM | Admit: 2023-04-22 | Discharge: 2023-04-22 | Disposition: A | Discharge status: Home / Self Care | Payer: Medicare HMO | Attending: Emergency Medicine | Admitting: Emergency Medicine

## 2023-04-22 ENCOUNTER — Other Ambulatory Visit: Payer: Self-pay

## 2023-04-22 DIAGNOSIS — R531 Weakness: Secondary | ICD-10-CM

## 2023-04-22 DIAGNOSIS — F0390 Unspecified dementia without behavioral disturbance: Secondary | ICD-10-CM | POA: Diagnosis not present

## 2023-04-22 DIAGNOSIS — U071 COVID-19: Secondary | ICD-10-CM | POA: Insufficient documentation

## 2023-04-22 DIAGNOSIS — Z7901 Long term (current) use of anticoagulants: Secondary | ICD-10-CM | POA: Diagnosis not present

## 2023-04-22 LAB — CBC
HCT: 41.6 % (ref 36.0–46.0)
Hemoglobin: 13.9 g/dL (ref 12.0–15.0)
MCH: 31.7 pg (ref 26.0–34.0)
MCHC: 33.4 g/dL (ref 30.0–36.0)
MCV: 94.8 fL (ref 80.0–100.0)
Platelets: 281 10*3/uL (ref 150–400)
RBC: 4.39 MIL/uL (ref 3.87–5.11)
RDW: 14.6 % (ref 11.5–15.5)
WBC: 4 10*3/uL (ref 4.0–10.5)
nRBC: 0 % (ref 0.0–0.2)

## 2023-04-22 LAB — URINALYSIS, ROUTINE W REFLEX MICROSCOPIC
Bacteria, UA: NONE SEEN
Bilirubin Urine: NEGATIVE
Glucose, UA: NEGATIVE mg/dL
Hgb urine dipstick: NEGATIVE
Ketones, ur: NEGATIVE mg/dL
Nitrite: NEGATIVE
Protein, ur: NEGATIVE mg/dL
Specific Gravity, Urine: 1.004 — ABNORMAL LOW (ref 1.005–1.030)
pH: 6 (ref 5.0–8.0)

## 2023-04-22 LAB — CBG MONITORING, ED: Glucose-Capillary: 91 mg/dL (ref 70–99)

## 2023-04-22 LAB — BASIC METABOLIC PANEL
Anion gap: 6 (ref 5–15)
BUN: 16 mg/dL (ref 8–23)
CO2: 28 mmol/L (ref 22–32)
Calcium: 9 mg/dL (ref 8.9–10.3)
Chloride: 104 mmol/L (ref 98–111)
Creatinine, Ser: 0.63 mg/dL (ref 0.44–1.00)
GFR, Estimated: >60 mL/min (ref >60)
Glucose, Bld: 105 mg/dL — ABNORMAL HIGH (ref 70–99)
Potassium: 4.3 mmol/L (ref 3.5–5.1)
Sodium: 138 mmol/L (ref 135–145)

## 2023-04-22 LAB — HEPATIC FUNCTION PANEL
ALT: 22 U/L (ref 0–44)
AST: 40 U/L (ref 15–41)
Albumin: 3.1 g/dL — ABNORMAL LOW (ref 3.5–5.0)
Alkaline Phosphatase: 81 U/L (ref 38–126)
Bilirubin, Direct: 0.2 mg/dL (ref 0.0–0.2)
Indirect Bilirubin: 0.4 mg/dL (ref 0.3–0.9)
Total Bilirubin: 0.6 mg/dL (ref 0.0–1.2)
Total Protein: 6.6 g/dL (ref 6.5–8.1)

## 2023-04-22 LAB — RESP PANEL BY RT-PCR (RSV, FLU A&B, COVID)  RVPGX2
Influenza A by PCR: NEGATIVE
Influenza B by PCR: NEGATIVE
Resp Syncytial Virus by PCR: NEGATIVE
SARS Coronavirus 2 by RT PCR: POSITIVE — AB

## 2023-04-22 LAB — TROPONIN I (HIGH SENSITIVITY): Troponin I (High Sensitivity): 5 ng/L (ref <18)

## 2023-04-22 MED ORDER — ONDANSETRON 4 MG PO TBDP
4.0000 mg | ORAL_TABLET | Freq: Once | ORAL | Status: AC
  Administered 2023-04-22: 4 mg via ORAL
  Filled 2023-04-22: qty 1

## 2023-04-22 MED ORDER — SODIUM CHLORIDE 0.9 % IV BOLUS
1000.0000 mL | Freq: Once | INTRAVENOUS | Status: AC
  Administered 2023-04-22: 1000 mL via INTRAVENOUS

## 2023-04-22 NOTE — ED Notes (Signed)
 Patient ambulated around ER using walker with steady gait. Denies dizziness while walking. MD notified.

## 2023-04-22 NOTE — ED Provider Triage Note (Signed)
 Emergency Medicine Provider Triage Evaluation Note  Alyssa Small , a 78 y.o. female  was evaluated in triage.  Pt complains of generalized weakness.  Reportedly family called EMS due to patient feeling weak.  Was reportedly recently diagnosed with UTI.  Patient denies any complaints.  She is not sure why family called.  She notes chronic intermittent confusion has history of vascular dementia.  Review of Systems  Positive: Generalized weakness, confusion Negative: Belly pain, vomiting, fever  Physical Exam  BP 136/82 (BP Location: Right Arm)   Pulse 78   Temp 98.5 F (36.9 C) (Oral)   Resp 16   SpO2 98%  Gen:   Awake, no distress   Resp:  Normal effort  MSK:   Moves extremities without difficulty  Other:  Slightly confused, no focal deficits  Medical Decision Making  Medically screening exam initiated at 6:12 PM.  Appropriate orders placed.  Alyssa Small was informed that the remainder of the evaluation will be completed by another provider, this initial triage assessment does not replace that evaluation, and the importance of remaining in the ED until their evaluation is complete.  Given baseline confusion we will admit her for further workup.   Alyssa Spates, MD 04/22/23 [phone]

## 2023-04-22 NOTE — ED Triage Notes (Signed)
 Patient from home via EMS for continued weakness and fatigue, especially on standing. Endorses DOE. Dx with UTI a few days ago and taking abx for same. 500 cc NS and 4mg  zofran given by EMS.

## 2023-04-22 NOTE — ED Notes (Signed)
 Patient discharged and escorted out of ED via wheelchair. Patient discharged with son. No questions at this time. All belongings with patient.

## 2023-04-22 NOTE — Discharge Instructions (Signed)
 It is important to eat and drink appropriately and increase your fluid intake at home.  If you develop high fever, severe cough or cough with blood, trouble breathing, severe headache, neck pain/stiffness, vomiting, or any other new/concerning symptoms then return to the ER for evaluation

## 2023-04-22 NOTE — ED Provider Notes (Signed)
 [place] EMERGENCY DEPARTMENT AT [facility] Provider Note   CSN: [id number] Arrival date & time: 04/22/23  1700     History  Chief Complaint  Patient presents with   Weakness   Urinary Tract Infection    Alyssa Small is a 78 y.o. female.  HPI 78 year old female presents with generalized weakness.  History is from patient as well as the son.  She has some dementia but seems to be at her mental baseline according to the son.  She has been had a cough with occasional clear sputum for about a week.  Has on and off complained of some midsternal chest pain.  No abdominal pain or focal weakness but she feels generally weak and today she felt like she could not get out of bed because she was lightheaded when she tried to get up.  She normally walks without a walker though does have 1 available.  She denies any current pain such as headache or chest pain but has had an on-and-off headache in the past.  No leg swelling.  Was started on Macrobid a few days ago for a UTI. She states the dizziness is currently gone.   Home Medications Prior to Admission medications   Medication Sig Start Date End Date Taking? Authorizing Provider  albuterol (VENTOLIN HFA) 108 (90 Base) MCG/ACT inhaler Inhale 2 puffs into the lungs every 4 (four) hours as needed for shortness of breath. 09/24/19   [provider]  busPIRone (BUSPAR) 10 MG tablet Take 10 mg by mouth 3 (three) times daily as needed. 10/24/19   [provider]  citalopram (CELEXA) 20 MG tablet Take 1 tablet (20 mg total) by mouth daily. 10/13/19   Rinehuls, Kinnie Scales, PA-C  diphenhydrAMINE (BENADRYL) 25 mg capsule Take 25 mg by mouth every 6 (six) hours as needed for itching.    [provider]  ELIQUIS 5 MG TABS tablet TAKE 1 TABLET BY MOUTH TWICE A DAY 10/04/21   Rollene Rotunda, MD  famotidine (PEPCID) 40 MG tablet Take 40 mg by mouth daily. 10/24/19   [provider]  fluticasone (FLONASE) 50 MCG/ACT nasal  spray Place 1 spray into both nostrils daily as needed for allergies. 01/07/15   [provider]  levothyroxine (SYNTHROID) 75 MCG tablet Take 75 mcg by mouth every morning. 07/12/19   [provider]  metoprolol tartrate (LOPRESSOR) 50 MG tablet Take 1 tablet (50 mg total) by mouth 2 (two) times daily. 10/12/19   Rinehuls, Kinnie Scales, PA-C  pantoprazole (PROTONIX) 40 MG tablet Take 40 mg by mouth daily. 10/24/19   [provider]  simvastatin (ZOCOR) 40 MG tablet Take 1 tablet (40 mg total) by mouth at bedtime. 10/12/19   Rinehuls, Kinnie Scales, PA-C  traZODone (DESYREL) 50 MG tablet Take 50 mg by mouth at bedtime. 08/29/19   [provider]  Vitamin D, Ergocalciferol, (DRISDOL) 1.25 MG (50000 UNIT) CAPS capsule Take 50,000 Units by mouth every 14 (fourteen) days. 08/24/19   [provider]      Allergies    Atorvastatin, Zolpidem, and Ceftriaxone    Review of Systems   Review of Systems  Constitutional:  Positive for fatigue. Negative for fever.  Respiratory:  Positive for cough. Negative for shortness of breath.   Cardiovascular:  Positive for chest pain.  Gastrointestinal:  Negative for abdominal pain and vomiting.  Neurological:  Positive for weakness, light-headedness and headaches.    Physical Exam Updated Vital Signs BP 136/82 (BP Location: Right Arm)  Pulse 97   Temp 98.5 F (36.9 C) (Oral)   Resp 16   SpO2 100%  Physical Exam Vitals and nursing note reviewed.  Constitutional:      General: She is not in acute distress.    Appearance: She is well-developed. She is not ill-appearing or diaphoretic.  HENT:     Head: Normocephalic and atraumatic.  Cardiovascular:     Rate and Rhythm: Normal rate and regular rhythm.     Heart sounds: Normal heart sounds.  Pulmonary:     Effort: Pulmonary effort is normal.     Breath sounds: Normal breath sounds. No wheezing, rhonchi or rales.  Abdominal:     General: There is no distension.      Palpations: Abdomen is soft.     Tenderness: There is no abdominal tenderness.  Musculoskeletal:     Right lower leg: No edema.     Left lower leg: No edema.  Skin:    General: Skin is warm and dry.  Neurological:     Mental Status: She is alert.     Comments: Awake, alert, oriented to person, place, situation. Clear speech, no facial droop. 5/5 strength in all 4 extremities. Grossly normal sensation. Normal finger to nose.      ED Results / Procedures / Treatments   Labs (all labs ordered are listed, but only abnormal results are displayed) Labs Reviewed  RESP PANEL BY RT-PCR (RSV, FLU A&B, COVID)  RVPGX2 - Abnormal; Notable for the following components:      Result Value   SARS Coronavirus 2 by RT PCR POSITIVE (*)    All other components within normal limits  BASIC METABOLIC PANEL - Abnormal; Notable for the following components:   Glucose, Bld 105 (*)    All other components within normal limits  URINALYSIS, ROUTINE W REFLEX MICROSCOPIC - Abnormal; Notable for the following components:   Specific Gravity, Urine 1.004 (*)    Leukocytes,Ua SMALL (*)    All other components within normal limits  HEPATIC FUNCTION PANEL - Abnormal; Notable for the following components:   Albumin 3.1 (*)    All other components within normal limits  CBC  CBG MONITORING, ED  TROPONIN I (HIGH SENSITIVITY)    EKG EKG Interpretation Date/Time:  Saturday April 22 2023 17:23:35 EST Ventricular Rate:  96 PR Interval:    QRS Duration:  70 QT Interval:  360 QTC Calculation: 454 R Axis:   11  Text Interpretation: Atrial fibrillation Possible Anteroseptal infarct , age undetermined Abnormal ECG  aifb seems new since 2021 Confirmed by Pricilla Loveless [phone]) on 04/22/2023 6:28:17 PM  Radiology CT Head Wo Contrast Result Date: 04/22/2023 CLINICAL DATA:  Headache EXAM: CT HEAD WITHOUT CONTRAST TECHNIQUE: Contiguous axial images were obtained from the base of the skull through the vertex without  intravenous contrast. RADIATION DOSE REDUCTION: This exam was performed according to the departmental dose-optimization program which includes automated exposure control, adjustment of the mA and/or kV according to patient size and/or use of iterative reconstruction technique. COMPARISON:  MRI brain 10/07/2019. FINDINGS: Brain: No evidence of acute infarction, hemorrhage, hydrocephalus, extra-axial collection or mass lesion/mass effect. Mild diffuse atrophy is again seen. There is mild periventricular white matter hypodensity, likely chronic small vessel ischemic change, similar to prior. Old infarct is seen in the inferior left cerebellum, unchanged. Vascular: No hyperdense vessel or unexpected calcification. Skull: Normal. Negative for fracture or focal lesion. Sinuses/Orbits: No acute finding. Other: None. IMPRESSION: 1. No acute intracranial process. 2. Mild  diffuse atrophy and chronic small vessel ischemic change. 3. 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 Electronically Signed   By: Darliss Cheney M.D.   On: 04/22/2023 20:24   DG Chest 2 View Result Date: 04/22/2023 CLINICAL DATA:  Cough EXAM: CHEST - 2 VIEW COMPARISON:  08/22/2022 FINDINGS: The heart size and mediastinal contours are within normal limits. No focal airspace consolidation, pleural effusion, or pneumothorax. IMPRESSION: No active cardiopulmonary disease. Electronically Signed   By: Duanne Guess D.O.   On: 04/22/2023 20:05    Procedures Procedures    Medications Ordered in ED Medications  ondansetron (ZOFRAN-ODT) disintegrating tablet 4 mg (4 mg Oral Given 04/22/23 1812)  sodium chloride 0.9 % bolus 1,000 mL (0 mLs Intravenous Stopped 04/22/23 2132)    ED Course/ Medical Decision Making/ A&P                                 Medical Decision Making Amount and/or Complexity of Data Reviewed Independent Historian:     Details: Son External Data Reviewed: notes. Labs: ordered.    Details: COVID-positive.  Normal WBC and  troponin Radiology: ordered and independent interpretation performed.    Details: No pneumonia or head bleed ECG/medicine tests: ordered and independent interpretation performed.    Details: A-fib  Risk Prescription drug management.   Patient presents with generalized weakness, I suspect this is from some deconditioning from COVID.  She was given a bolus of fluids and was able to get up and ambulate and did okay.  She is feeling better and with a negative workup otherwise and unremarkable vitals I think she is stable for discharge.  A-fib seems to be a chronic issue.  She is not altered from baseline according to the son.  At this point, I think she is stable for discharge, no hypoxia or increased work of breathing.  Urine is unremarkable though I do think is reasonable to finish the antibiotics given the abnormal urine with the PCP.  Discharged home with return precautions        Final Clinical Impression(s) / ED Diagnoses Final diagnoses:  COVID-19  Generalized weakness    Rx / DC Orders ED Discharge Orders     None         Pricilla Loveless, MD 04/22/23 2342

## 2023-04-22 NOTE — ED Notes (Signed)
Called and added labs
